# Patient Record
Sex: Female | Born: 2000 | Race: Black or African American | Hispanic: No | Marital: Single | Smoking: Never smoker
Health system: Southern US, Community
[De-identification: ages and names within clinical notes are randomized; demographics above are authoritative.]

## PROBLEM LIST (undated history)

## (undated) DIAGNOSIS — A64 Unspecified sexually transmitted disease: Secondary | ICD-10-CM

## (undated) DIAGNOSIS — Z8616 Personal history of COVID-19: Secondary | ICD-10-CM

## (undated) DIAGNOSIS — Z62810 Personal history of physical and sexual abuse in childhood: Secondary | ICD-10-CM

## (undated) DIAGNOSIS — F32A Depression, unspecified: Secondary | ICD-10-CM

## (undated) DIAGNOSIS — J45909 Unspecified asthma, uncomplicated: Secondary | ICD-10-CM

## (undated) DIAGNOSIS — Z8679 Personal history of other diseases of the circulatory system: Secondary | ICD-10-CM

## (undated) DIAGNOSIS — S79919A Unspecified injury of unspecified hip, initial encounter: Secondary | ICD-10-CM

## (undated) DIAGNOSIS — F329 Major depressive disorder, single episode, unspecified: Secondary | ICD-10-CM

## (undated) DIAGNOSIS — Z87898 Personal history of other specified conditions: Secondary | ICD-10-CM

## (undated) DIAGNOSIS — G43909 Migraine, unspecified, not intractable, without status migrainosus: Secondary | ICD-10-CM

## (undated) HISTORY — DX: Depression, unspecified: F32.A

## (undated) HISTORY — DX: Personal history of COVID-19: Z86.16

## (undated) HISTORY — DX: Unspecified injury of unspecified hip, initial encounter: S79.919A

## (undated) HISTORY — DX: Personal history of physical and sexual abuse in childhood: Z62.810

## (undated) HISTORY — DX: Migraine, unspecified, not intractable, without status migrainosus: G43.909

## (undated) HISTORY — DX: Personal history of other diseases of the circulatory system: Z86.79

## (undated) HISTORY — DX: Unspecified asthma, uncomplicated: J45.909

## (undated) HISTORY — DX: Unspecified sexually transmitted disease: A64

## (undated) HISTORY — DX: Personal history of other specified conditions: Z87.898

---
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
## 2019-01-16 ENCOUNTER — Encounter: Payer: Self-pay | Admitting: Family Medicine

## 2019-01-16 ENCOUNTER — Ambulatory Visit (INDEPENDENT_AMBULATORY_CARE_PROVIDER_SITE_OTHER): Payer: Managed Care, Other (non HMO) | Admitting: Family Medicine

## 2019-01-16 ENCOUNTER — Other Ambulatory Visit: Payer: Self-pay

## 2019-01-16 VITALS — BP 118/72 | HR 73 | Temp 98.0°F | Ht 62.25 in | Wt 173.8 lb

## 2019-01-16 DIAGNOSIS — G43909 Migraine, unspecified, not intractable, without status migrainosus: Secondary | ICD-10-CM

## 2019-01-16 DIAGNOSIS — J4520 Mild intermittent asthma, uncomplicated: Secondary | ICD-10-CM | POA: Diagnosis not present

## 2019-01-16 MED ORDER — ALBUTEROL SULFATE HFA 108 (90 BASE) MCG/ACT IN AERS: 2.0000 | INHALATION_SPRAY | Freq: Four times a day (QID) | RESPIRATORY_TRACT | 5 refills | Status: AC | PRN
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
## 2019-02-27 ENCOUNTER — Other Ambulatory Visit: Payer: Self-pay

## 2019-02-27 ENCOUNTER — Other Ambulatory Visit (HOSPITAL_COMMUNITY)
Admission: RE | Admit: 2019-02-27 | Discharge: 2019-02-27 | Disposition: A | Discharge status: Home / Self Care | Payer: Managed Care, Other (non HMO) | Source: Ambulatory Visit | Attending: Obstetrics and Gynecology | Admitting: Obstetrics and Gynecology

## 2019-02-27 ENCOUNTER — Ambulatory Visit: Payer: Managed Care, Other (non HMO) | Admitting: Obstetrics and Gynecology

## 2019-02-27 ENCOUNTER — Encounter: Payer: Self-pay | Admitting: Obstetrics and Gynecology

## 2019-02-27 VITALS — BP 110/66 | HR 84 | Temp 97.3°F | Resp 16 | Ht 61.5 in | Wt 177.0 lb

## 2019-02-27 DIAGNOSIS — Z01419 Encounter for gynecological examination (general) (routine) without abnormal findings: Secondary | ICD-10-CM | POA: Diagnosis not present

## 2019-02-27 DIAGNOSIS — Z113 Encounter for screening for infections with a predominantly sexual mode of transmission: Secondary | ICD-10-CM | POA: Insufficient documentation

## 2019-02-27 NOTE — Patient Instructions (Addendum)
What to Expect Before Expecting is a good book to read in preparation for pregnancy.   Testing for fertility will include ovulation testing for you, semen analysis for your partner, and a hysterosalpingogram to check your fallopian tubes for blockage.  EXERCISE AND DIET:  We recommended that you start or continue a regular exercise program for good health. Regular exercise means any activity that makes your heart beat faster and makes you sweat.  We recommend exercising at least 30 minutes per day at least 3 days a week, preferably 4 or 5.  We also recommend a diet low in fat and sugar.  Inactivity, poor dietary choices and obesity can cause diabetes, heart attack, stroke, and kidney damage, among others.    ALCOHOL AND SMOKING:  Women should limit their alcohol intake to no more than 7 drinks/beers/glasses of wine (combined, not each!) per week. Moderation of alcohol intake to this level decreases your risk of breast cancer and liver damage. And of course, no recreational drugs are part of a healthy lifestyle.  And absolutely no smoking or even second hand smoke. Most people know smoking can cause heart and lung diseases, but did you know it also contributes to weakening of your bones? Aging of your skin?  Yellowing of your teeth and nails?  CALCIUM AND VITAMIN D:  Adequate intake of calcium and Vitamin D are recommended.  The recommendations for exact amounts of these supplements seem to change often, but generally speaking 600 mg of calcium (either carbonate or citrate) and 800 units of Vitamin D per day seems prudent. Certain women may benefit from higher intake of Vitamin D.  If you are among these women, your doctor will have told you during your visit.    PAP SMEARS:  Pap smears, to check for cervical cancer or precancers,  have traditionally been done yearly, although recent scientific advances have shown that most women can have pap smears less often.  However, every woman still should have a  physical exam from her gynecologist every year. It will include a breast check, inspection of the vulva and vagina to check for abnormal growths or skin changes, a visual exam of the cervix, and then an exam to evaluate the size and shape of the uterus and ovaries.  And after 19 years of age, a rectal exam is indicated to check for rectal cancers. We will also provide age appropriate advice regarding health maintenance, like when you should have certain vaccines, screening for sexually transmitted diseases, bone density testing, colonoscopy, mammograms, etc.   MAMMOGRAMS:  All women over 19 years old should have a yearly mammogram. Many facilities now offer a "3D" mammogram, which may cost around $50 extra out of pocket. If possible,  we recommend you accept the option to have the 3D mammogram performed.  It both reduces the number of women who will be called back for extra views which then turn out to be normal, and it is better than the routine mammogram at detecting truly abnormal areas.    COLONOSCOPY:  Colonoscopy to screen for colon cancer is recommended for all women at age 19.  We know, you hate the idea of the prep.  We agree, BUT, having colon cancer and not knowing it is worse!!  Colon cancer so often starts as a polyp that can be seen and removed at colonscopy, which can quite literally save your life!  And if your first colonoscopy is normal and you have no family history of colon cancer, most  women don't have to have it again for 10 years.  Once every ten years, you can do something that may end up saving your life, right?  We will be happy to help you get it scheduled when you are ready.  Be sure to check your insurance coverage so you understand how much it will cost.  It may be covered as a preventative service at no cost, but you should check your particular policy.    HPV (Human Papillomavirus) Vaccine: What You Need to Know 1. Why get vaccinated? HPV (Human papillomavirus) vaccine can  prevent infection with some types of human papillomavirus. HPV infections can cause certain types of cancers including:  cervical, vaginal and vulvar cancers in women,  penile cancer in men, and  anal cancers in both men and women. HPV vaccine prevents infection from the HPV types that cause over 90% of these cancers. HPV is spread through intimate skin-to-skin or sexual contact. HPV infections are so common that nearly all men and women will get at least one type of HPV at some time in their lives. Most HPV infections go away by themselves within 2 years. But sometimes HPV infections will last longer and can cause cancers later in life. 2. HPV vaccine HPV vaccine is routinely recommended for adolescents at 65 or 19 years of age to ensure they are protected before they are exposed to the virus. HPV vaccine may be given beginning at age 64 years, and as late as age 48 years. Most people older than 26 years will not benefit from HPV vaccination. Talk with your health care provider if you want more information. Most children who get the first dose before 24 years of age need 2 doses of HPV vaccine. Anyone who gets the first dose on or after 19 years of age, and younger people with certain immunocompromising conditions, need 3 doses. Your health care provider can give you more information. HPV vaccine may be given at the same time as other vaccines. 3. Talk with your health care provider Tell your vaccine provider if the person getting the vaccine:  Has had an allergic reaction after a previous dose of HPV vaccine, or has any severe, life-threatening allergies.  Is pregnant. In some cases, your health care provider may decide to postpone HPV vaccination to a future visit. People with minor illnesses, such as a cold, may be vaccinated. People who are moderately or severely ill should usually wait until they recover before getting HPV vaccine. Your health care provider can give you more  information. 4. Risks of a vaccine reaction  Soreness, redness, or swelling where the shot is given can happen after HPV vaccine.  Fever or headache can happen after HPV vaccine. People sometimes faint after medical procedures, including vaccination. Tell your provider if you feel dizzy or have vision changes or ringing in the ears. As with any medicine, there is a very remote chance of a vaccine causing a severe allergic reaction, other serious injury, or death. 5. What if there is a serious problem? An allergic reaction could occur after the vaccinated person leaves the clinic. If you see signs of a severe allergic reaction (hives, swelling of the face and throat, difficulty breathing, a fast heartbeat, dizziness, or weakness), call 9-1-1 and get the person to the nearest hospital. For other signs that concern you, call your health care provider. Adverse reactions should be reported to the Vaccine Adverse Event Reporting System (VAERS). Your health care provider will usually file this report,  or you can do it yourself. Visit the VAERS website at www.vaers.LAgents.no or call (979)308-0291. VAERS is only for reporting reactions, and VAERS staff do not give medical advice. 6. The National Vaccine Injury Compensation Program The [employer] Vaccine Injury Compensation Program (VICP) is a federal program that was created to compensate people who may have been injured by certain vaccines. Visit the VICP website at SpiritualWord.at or call 908-007-3931 to learn about the program and about filing a claim. There is a time limit to file a claim for compensation. 7. How can I learn more?  Ask your health care provider.  Call your local or state health department.  Contact the Centers for Disease Control and Prevention (CDC): ? Call 9306716964 (1-800-CDC-INFO) or ? Visit CDC's website at PicCapture.uy Vaccine Information Statement HPV Vaccine (01/19/2018) This information is not  intended to replace advice given to you by your health care provider. Make sure you discuss any questions you have with your health care provider. Document Released: 10/04/2013 Document Revised: 06/28/2018 Document Reviewed: 10/19/2017 Elsevier Patient Education  2020 [employer].
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
## 2019-02-28 LAB — HEP, RPR, HIV PANEL
HIV Screen 4th Generation wRfx: NONREACTIVE
Hepatitis B Surface Ag: NEGATIVE
RPR Ser Ql: NONREACTIVE

## 2019-02-28 LAB — HEPATITIS C ANTIBODY: Hep C Virus Ab: <0.1 s/co ratio (ref 0.0–0.9)

## 2019-03-01 LAB — CERVICOVAGINAL ANCILLARY ONLY
Chlamydia: NEGATIVE
Comment: NEGATIVE
Comment: NEGATIVE
Comment: NORMAL
Neisseria Gonorrhea: NEGATIVE
Trichomonas: NEGATIVE

## 2019-05-27 IMAGING — CR DG ABDOMEN 1V
1 series · 2 of 2 positions shown · non-contrast
Comparison: None.

CLINICAL DATA: Umbilical pain.  Nausea.

EXAM:
ABDOMEN - 1 VIEW

[Series 1: dg abd 1 view · 0.14mm/px · 2 of 2 slices shown]
[im 1/2]
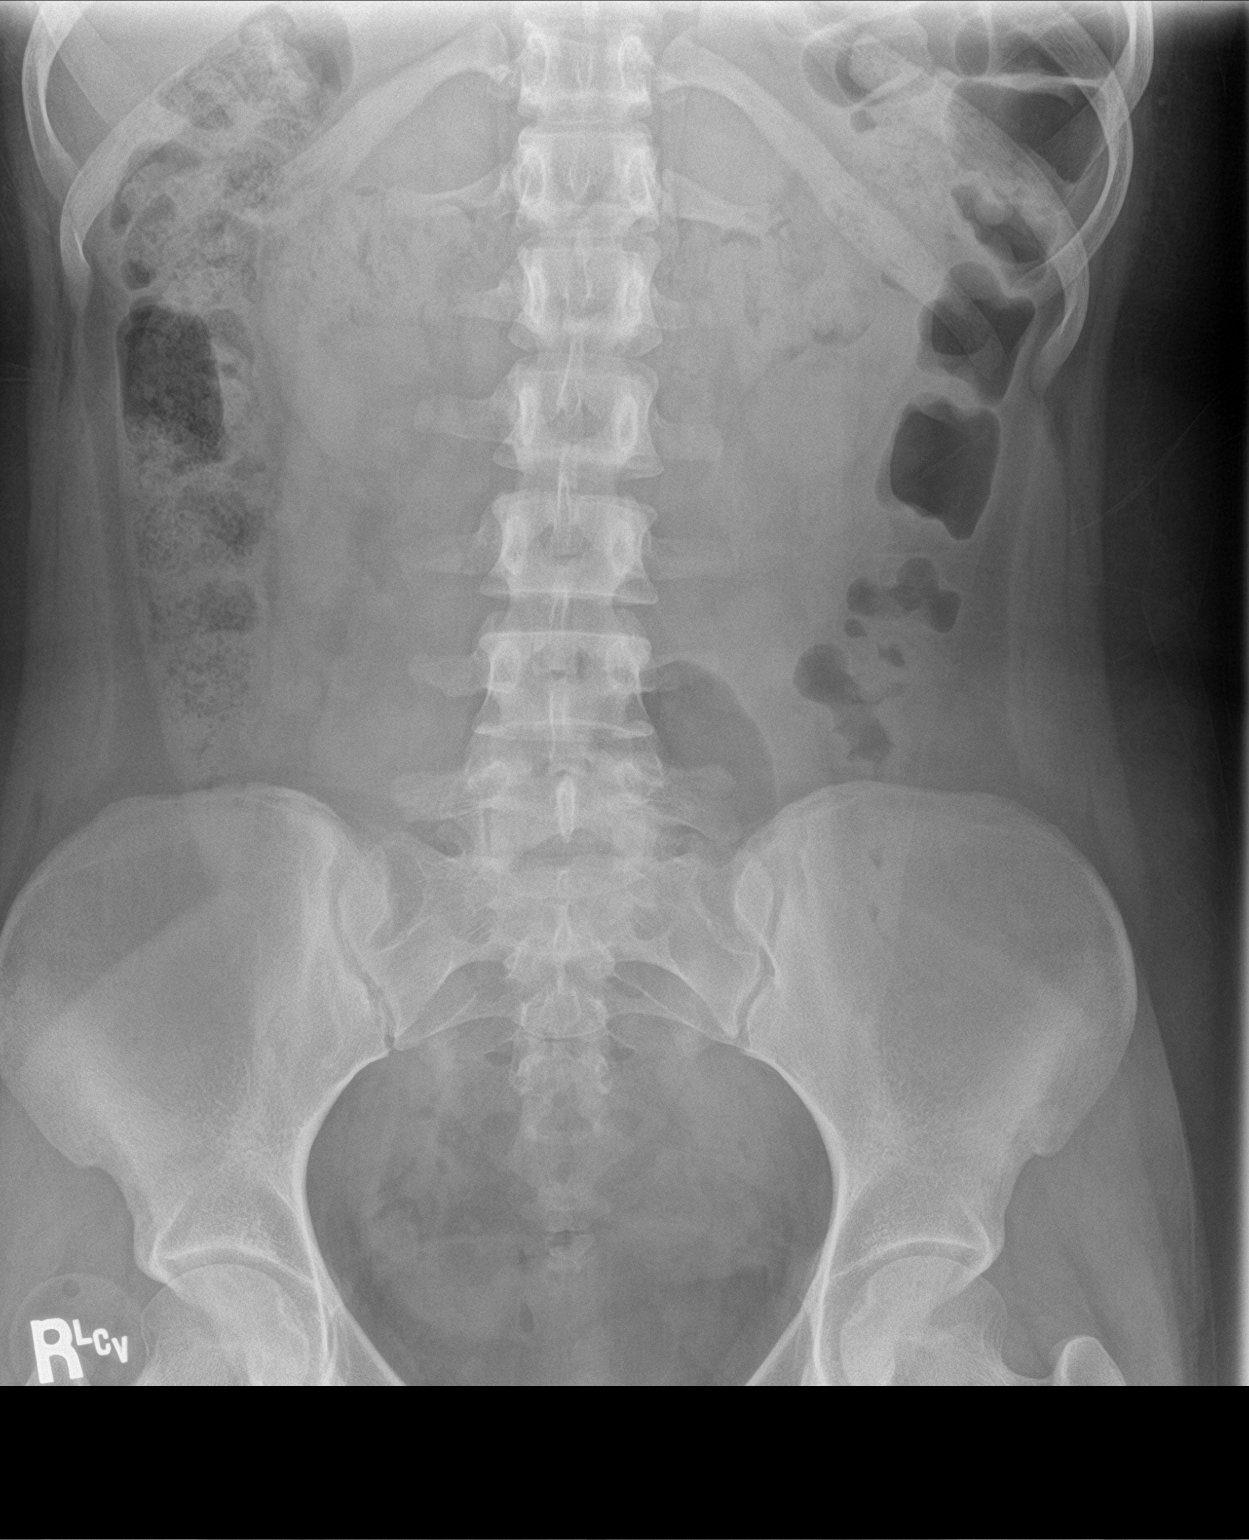
[im 2/2]
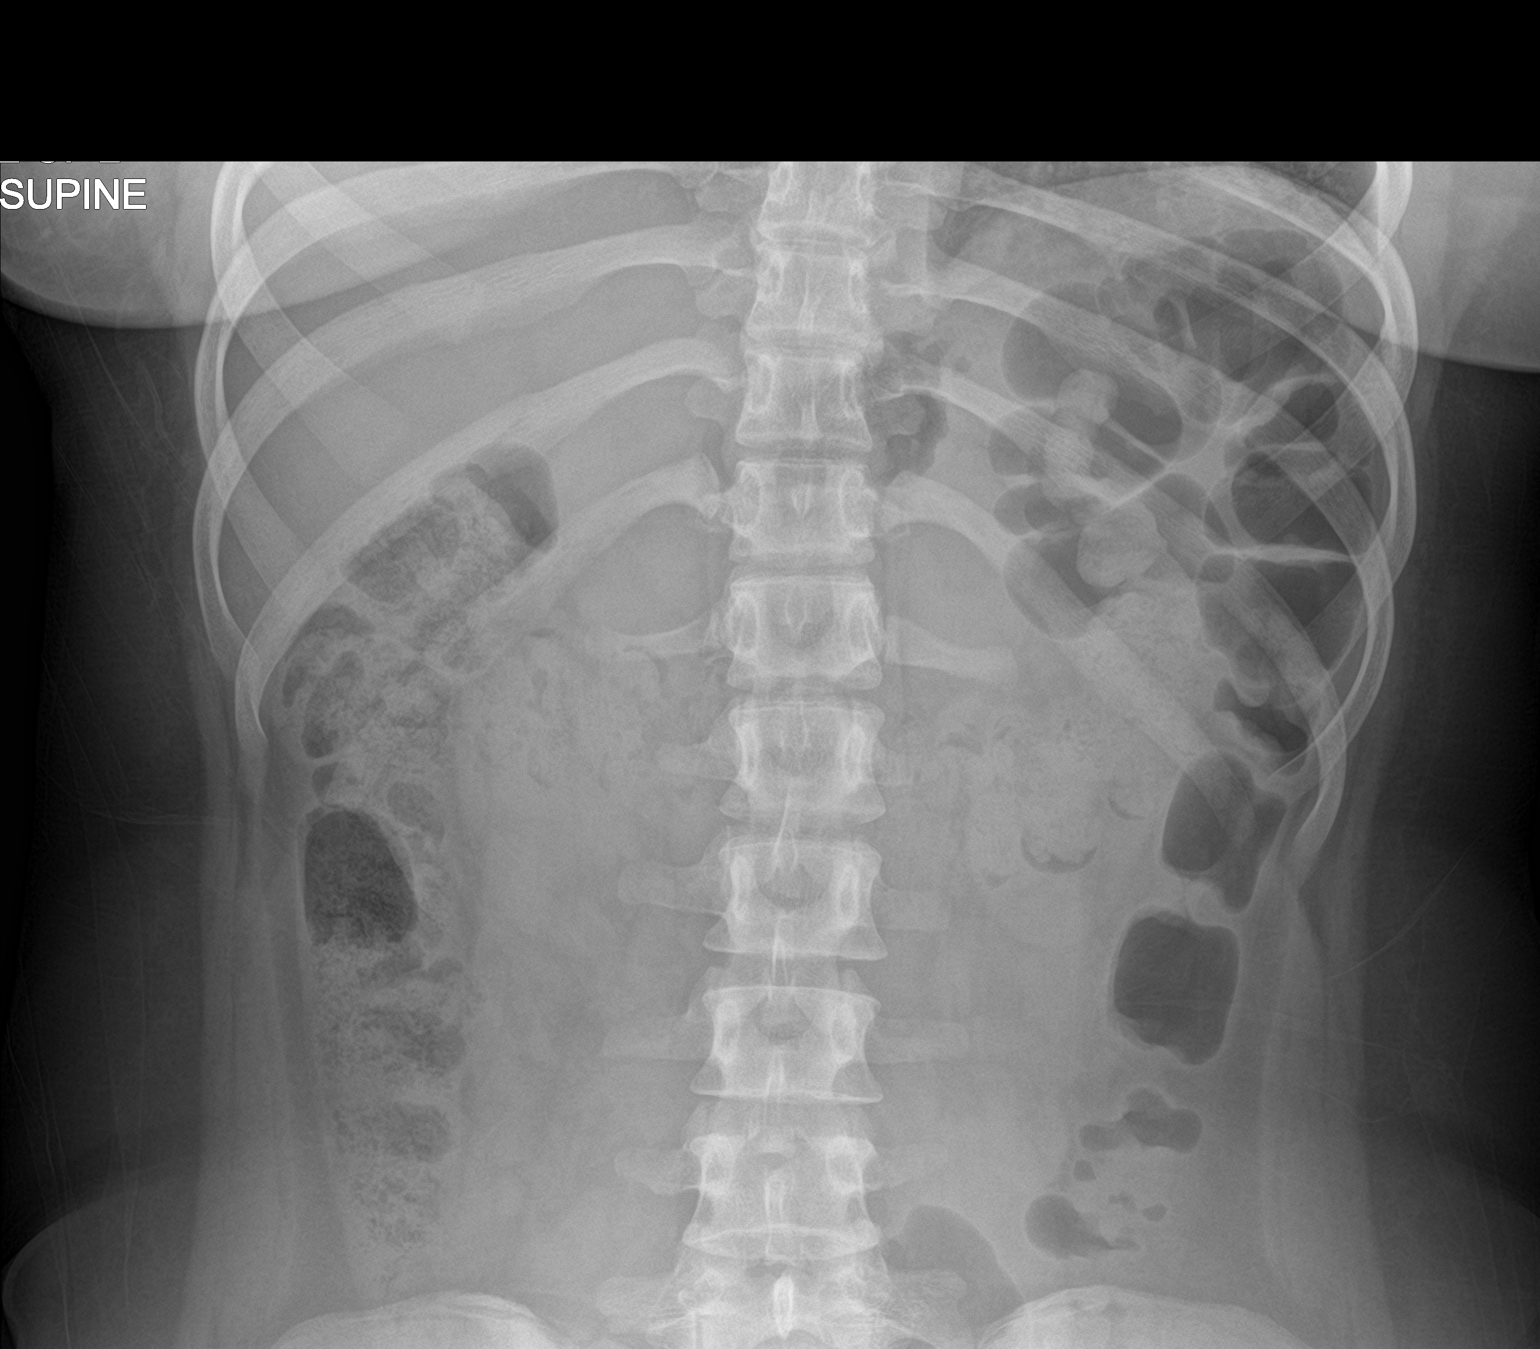

[2 of 2 positions shown; findings below may reference images not displayed]

FINDINGS: The bowel gas pattern is normal. No radio-opaque calculi or other
significant radiographic abnormality are seen.
IMPRESSION: Negative.

## 2019-06-27 ENCOUNTER — Ambulatory Visit: Payer: Managed Care, Other (non HMO) | Admitting: Obstetrics and Gynecology

## 2019-06-27 ENCOUNTER — Other Ambulatory Visit: Payer: Self-pay

## 2019-06-27 ENCOUNTER — Encounter: Payer: Self-pay | Admitting: Obstetrics and Gynecology

## 2019-06-27 ENCOUNTER — Telehealth: Payer: Self-pay | Admitting: Obstetrics and Gynecology

## 2019-06-27 VITALS — BP 100/68 | HR 76 | Temp 98.4°F | Ht 61.5 in | Wt 171.4 lb

## 2019-06-27 DIAGNOSIS — N926 Irregular menstruation, unspecified: Secondary | ICD-10-CM

## 2019-06-27 LAB — POCT URINE PREGNANCY: Preg Test, Ur: NEGATIVE
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
## 2019-06-27 NOTE — Telephone Encounter (Signed)
Encounter closed

## 2019-06-27 NOTE — Telephone Encounter (Signed)
Return call to patient. Was seen at urgent care yesterday ( has records) for abnormal menses and STD testing.  Condoms for contraception. LMP 06-03-19 and lighter than usual menses now with cramps and nausea. UPT negative at urgent care yesterday.  Has also had persistent external vaginal irritation similar to previous yeast infection.  Urgent care recommended pelvic ultrasound. Last seen in office 02-2019 for annual. ( Hx chlamydia and infertility)  Appointment scheduled for today at 215. Patient aware this is work-in.   Current ultrasound options at 1230 and 3pm.   Routing to Dr Edward Jolly for review.

## 2019-06-27 NOTE — Telephone Encounter (Signed)
Patient is having pain and discomfort in vaginal area.

## 2019-06-27 NOTE — Telephone Encounter (Signed)
I will see patient for her office visit and then determine her additional needs.

## 2019-06-28 LAB — BETA HCG QUANT (REF LAB): hCG Quant: <1 m[IU]/mL

## 2019-07-01 MED ORDER — MEDROXYPROGESTERONE ACETATE 10 MG PO TABS: 10.0000 mg | ORAL_TABLET | Freq: Every day | ORAL | 0 refills | Status: DC

## 2019-07-01 NOTE — Addendum Note (Signed)
Addended by: Ardell Isaacs, Debbe Bales E on: 07/01/2019 07:31 AM   Modules accepted: Orders

## 2019-11-13 ENCOUNTER — Emergency Department
Admission: EM | Admit: 2019-11-13 | Discharge: 2019-11-13 | Disposition: A | Discharge status: Home / Self Care | Payer: Managed Care, Other (non HMO) | Attending: Emergency Medicine | Admitting: Emergency Medicine

## 2019-11-13 ENCOUNTER — Other Ambulatory Visit: Payer: Self-pay

## 2019-11-13 ENCOUNTER — Encounter: Payer: Self-pay | Admitting: Emergency Medicine

## 2019-11-13 DIAGNOSIS — N611 Abscess of the breast and nipple: Secondary | ICD-10-CM | POA: Insufficient documentation

## 2019-11-13 DIAGNOSIS — J45909 Unspecified asthma, uncomplicated: Secondary | ICD-10-CM | POA: Diagnosis not present

## 2019-11-13 DIAGNOSIS — Z79899 Other long term (current) drug therapy: Secondary | ICD-10-CM | POA: Diagnosis not present

## 2019-11-13 DIAGNOSIS — L0291 Cutaneous abscess, unspecified: Secondary | ICD-10-CM

## 2019-11-13 MED ORDER — LIDOCAINE HCL 1 % IJ SOLN
5.0000 mL | Freq: Once | INTRAMUSCULAR | Status: DC
  Filled 2019-11-13: qty 10

## 2019-11-13 MED ORDER — FENTANYL CITRATE (PF) 100 MCG/2ML IJ SOLN
50.0000 ug | Freq: Once | INTRAMUSCULAR | Status: AC
  Administered 2019-11-13: 50 ug via INTRAMUSCULAR
  Filled 2019-11-13: qty 2

## 2019-11-13 MED ORDER — SULFAMETHOXAZOLE-TRIMETHOPRIM 800-160 MG PO TABS: 1.0000 | ORAL_TABLET | Freq: Two times a day (BID) | ORAL | 0 refills | Status: AC

## 2019-11-13 NOTE — ED Triage Notes (Signed)
Pt reports has an abscess under her right breast. Pt states this has been coming and going since November. Pt states the draining is thick and white but this time the abscess is more painful.
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
## 2019-11-13 NOTE — ED Notes (Signed)
Pt ambulatory from lobby in NAD. Pt reports abscess on right breast on and off since November. Pt reports she hit her breast while doing laundry today and it was painful.

## 2019-11-13 NOTE — Discharge Instructions (Signed)
Take Bactrim twice daily for the next 7 days. Do not submerge breast in fresh water for the next 48 hours. You can shower as normal in 24 hours.

## 2020-02-05 ENCOUNTER — Encounter: Payer: Self-pay | Admitting: Obstetrics & Gynecology

## 2020-02-05 ENCOUNTER — Ambulatory Visit (INDEPENDENT_AMBULATORY_CARE_PROVIDER_SITE_OTHER): Payer: Managed Care, Other (non HMO) | Admitting: Obstetrics & Gynecology

## 2020-02-05 ENCOUNTER — Encounter: Payer: Self-pay | Admitting: Radiology

## 2020-02-05 ENCOUNTER — Other Ambulatory Visit: Payer: Self-pay

## 2020-02-05 VITALS — BP 114/73 | HR 86 | Wt 162.0 lb

## 2020-02-05 DIAGNOSIS — O99211 Obesity complicating pregnancy, first trimester: Secondary | ICD-10-CM | POA: Diagnosis not present

## 2020-02-05 DIAGNOSIS — Z3401 Encounter for supervision of normal first pregnancy, first trimester: Secondary | ICD-10-CM | POA: Diagnosis not present

## 2020-02-05 DIAGNOSIS — Z3400 Encounter for supervision of normal first pregnancy, unspecified trimester: Secondary | ICD-10-CM | POA: Insufficient documentation

## 2020-02-05 DIAGNOSIS — E669 Obesity, unspecified: Secondary | ICD-10-CM

## 2020-02-05 DIAGNOSIS — Z3A11 11 weeks gestation of pregnancy: Secondary | ICD-10-CM | POA: Diagnosis not present

## 2020-02-05 DIAGNOSIS — Z6830 Body mass index (BMI) 30.0-30.9, adult: Secondary | ICD-10-CM

## 2020-02-05 MED ORDER — SCOPOLAMINE 1 MG/3DAYS TD PT72: 1.0000 | MEDICATED_PATCH | TRANSDERMAL | 0 refills | Status: DC

## 2020-02-05 MED ORDER — ONDANSETRON 4 MG PO TBDP: 4.0000 mg | ORAL_TABLET | Freq: Four times a day (QID) | ORAL | 0 refills | Status: DC | PRN

## 2020-02-05 NOTE — Progress Notes (Signed)
Decline flu vaccine. 

## 2020-02-05 NOTE — Progress Notes (Signed)
DATING AND VIABILITY SONOGRAM   Carly Williams is a 19 y.o. year old G1P0000 with LMP Patient's last menstrual period was 11/16/2019 (exact date). which would correlate to  [redacted]w[redacted]d weeks gestation.  She has regular menstrual cycles.   She is here today for a confirmatory initial sonogram.    GESTATION: SINGLETON yes     FETAL ACTIVITY:          Heart rate         173          The fetus is active.     GESTATIONAL AGE AND  BIOMETRICS:  Gestational criteria: Estimated Date of Delivery: 08/22/20 by LMP now at [redacted]w[redacted]d  Previous Scans:0  GESTATIONAL SAC            mm          weeks  CROWN RUMP LENGTH           4.77 cm         11.3 weeks                                                   AVERAGE EGA(BY THIS SCAN):  11.3 weeks  WORKING EDD( LMP ):  08/22/2020     TECHNICIAN COMMENTS: Patient informed that the ultrasound is considered a limited obstetric ultrasound and is not intended to be a complete ultrasound exam. Patient also informed that the ultrasound is not being completed with the intent of assessing for fetal or placental anomalies or any pelvic abnormalities. Explained that the purpose of today's ultrasound is to assess for fetal heart rate. Patient acknowledges the purpose of the exam and the limitations of the study.         A copy of this report including all images has been saved and backed up to a second source for retrieval if needed. All measures and details of the anatomical scan, placentation, fluid volume and pelvic anatomy are contained in that report.  Scheryl Marten 02/05/2020 2:02 PM

## 2020-02-05 NOTE — Patient Instructions (Signed)
First Trimester of Pregnancy The first trimester of pregnancy is from week 1 until the end of week 13 (months 1 through 3). A week after a sperm fertilizes an egg, the egg will implant on the wall of the uterus. This embryo will begin to develop into a baby. Genes from you and your partner will form the baby. The female genes will determine whether the baby will be a boy or a girl. At 6-8 weeks, the eyes and face will be formed, and the heartbeat can be seen on ultrasound. At the end of 12 weeks, all the baby's organs will be formed. Now that you are pregnant, you will want to do everything you can to have a healthy baby. Two of the most important things are to get good prenatal care and to follow your health care provider's instructions. Prenatal care is all the medical care you receive before the baby's birth. This care will help prevent, find, and treat any problems during the pregnancy and childbirth. Body changes during your first trimester Your body goes through many changes during pregnancy. The changes vary from woman to woman.  You may gain or lose a couple of pounds at first.  You may feel sick to your stomach (nauseous) and you may throw up (vomit). If the vomiting is uncontrollable, call your health care provider.  You may tire easily.  You may develop headaches that can be relieved by medicines. All medicines should be approved by your health care provider.  You may urinate more often. Painful urination may mean you have a bladder infection.  You may develop heartburn as a result of your pregnancy.  You may develop constipation because certain hormones are causing the muscles that push stool through your intestines to slow down.  You may develop hemorrhoids or swollen veins (varicose veins).  Your breasts may begin to grow larger and become tender. Your nipples may stick out more, and the tissue that surrounds them (areola) may become darker.  Your gums may bleed and may be  sensitive to brushing and flossing.  Dark spots or blotches (chloasma, mask of pregnancy) may develop on your face. This will likely fade after the baby is born.  Your menstrual periods will stop.  You may have a loss of appetite.  You may develop cravings for certain kinds of food.  You may have changes in your emotions from day to day, such as being excited to be pregnant or being concerned that something may go wrong with the pregnancy and baby.  You may have more vivid and strange dreams.  You may have changes in your hair. These can include thickening of your hair, rapid growth, and changes in texture. Some women also have hair loss during or after pregnancy, or hair that feels dry or thin. Your hair will most likely return to normal after your baby is born. What to expect at prenatal visits During a routine prenatal visit:  You will be weighed to make sure you and the baby are growing normally.  Your blood pressure will be taken.  Your abdomen will be measured to track your baby's growth.  The fetal heartbeat will be listened to between weeks 10 and 14 of your pregnancy.  Test results from any previous visits will be discussed. Your health care provider may ask you:  How you are feeling.  If you are feeling the baby move.  If you have had any abnormal symptoms, such as leaking fluid, bleeding, severe headaches, or abdominal   cramping.  If you are using any tobacco products, including cigarettes, chewing tobacco, and electronic cigarettes.  If you have any questions. Other tests that may be performed during your first trimester include:  Blood tests to find your blood type and to check for the presence of any previous infections. The tests will also be used to check for low iron levels (anemia) and protein on red blood cells (Rh antibodies). Depending on your risk factors, or if you previously had diabetes during pregnancy, you may have tests to check for high blood sugar  that affects pregnant women (gestational diabetes).  Urine tests to check for infections, diabetes, or protein in the urine.  An ultrasound to confirm the proper growth and development of the baby.  Fetal screens for spinal cord problems (spina bifida) and Down syndrome.  HIV (human immunodeficiency virus) testing. Routine prenatal testing includes screening for HIV, unless you choose not to have this test.  You may need other tests to make sure you and the baby are doing well. Follow these instructions at home: Medicines  Follow your health care provider's instructions regarding medicine use. Specific medicines may be either safe or unsafe to take during pregnancy.  Take a prenatal vitamin that contains at least 600 micrograms (mcg) of folic acid.  If you develop constipation, try taking a stool softener if your health care provider approves. Eating and drinking   Eat a balanced diet that includes fresh fruits and vegetables, whole grains, good sources of protein such as meat, eggs, or tofu, and low-fat dairy. Your health care provider will help you determine the amount of weight gain that is right for you.  Avoid raw meat and uncooked cheese. These carry germs that can cause birth defects in the baby.  Eating four or five small meals rather than three large meals a day may help relieve nausea and vomiting. If you start to feel nauseous, eating a few soda crackers can be helpful. Drinking liquids between meals, instead of during meals, also seems to help ease nausea and vomiting.  Limit foods that are high in fat and processed sugars, such as fried and sweet foods.  To prevent constipation: ? Eat foods that are high in fiber, such as fresh fruits and vegetables, whole grains, and beans. ? Drink enough fluid to keep your urine clear or pale yellow. Activity  Exercise only as directed by your health care provider. Most women can continue their usual exercise routine during  pregnancy. Try to exercise for 30 minutes at least 5 days a week. Exercising will help you: ? Control your weight. ? Stay in shape. ? Be prepared for labor and delivery.  Experiencing pain or cramping in the lower abdomen or lower back is a good sign that you should stop exercising. Check with your health care provider before continuing with normal exercises.  Try to avoid standing for long periods of time. Move your legs often if you must stand in one place for a long time.  Avoid heavy lifting.  Wear low-heeled shoes and practice good posture.  You may continue to have sex unless your health care provider tells you not to. Relieving pain and discomfort  Wear a good support bra to relieve breast tenderness.  Take warm sitz baths to soothe any pain or discomfort caused by hemorrhoids. Use hemorrhoid cream if your health care provider approves.  Rest with your legs elevated if you have leg cramps or low back pain.  If you develop varicose veins in   your legs, wear support hose. Elevate your feet for 15 minutes, 3-4 times a day. Limit salt in your diet. Prenatal care  Schedule your prenatal visits by the twelfth week of pregnancy. They are usually scheduled monthly at first, then more often in the last 2 months before delivery.  Write down your questions. Take them to your prenatal visits.  Keep all your prenatal visits as told by your health care provider. This is important. Safety  Wear your seat belt at all times when driving.  Make a list of emergency phone numbers, including numbers for family, friends, the hospital, and police and fire departments. General instructions  Ask your health care provider for a referral to a local prenatal education class. Begin classes no later than the beginning of month 6 of your pregnancy.  Ask for help if you have counseling or nutritional needs during pregnancy. Your health care provider can offer advice or refer you to specialists for help  with various needs.  Do not use hot tubs, steam rooms, or saunas.  Do not douche or use tampons or scented sanitary pads.  Do not cross your legs for long periods of time.  Avoid cat litter boxes and soil used by cats. These carry germs that can cause birth defects in the baby and possibly loss of the fetus by miscarriage or stillbirth.  Avoid all smoking, herbs, alcohol, and medicines not prescribed by your health care provider. Chemicals in these products affect the formation and growth of the baby.  Do not use any products that contain nicotine or tobacco, such as cigarettes and e-cigarettes. If you need help quitting, ask your health care provider. You may receive counseling support and other resources to help you quit.  Schedule a dentist appointment. At home, brush your teeth with a soft toothbrush and be gentle when you floss. Contact a health care provider if:  You have dizziness.  You have mild pelvic cramps, pelvic pressure, or nagging pain in the abdominal area.  You have persistent nausea, vomiting, or diarrhea.  You have a bad smelling vaginal discharge.  You have pain when you urinate.  You notice increased swelling in your face, hands, legs, or ankles.  You are exposed to fifth disease or chickenpox.  You are exposed to German measles (rubella) and have never had it. Get help right away if:  You have a fever.  You are leaking fluid from your vagina.  You have spotting or bleeding from your vagina.  You have severe abdominal cramping or pain.  You have rapid weight gain or loss.  You vomit blood or material that looks like coffee grounds.  You develop a severe headache.  You have shortness of breath.  You have any kind of trauma, such as from a fall or a car accident. Summary  The first trimester of pregnancy is from week 1 until the end of week 13 (months 1 through 3).  Your body goes through many changes during pregnancy. The changes vary from  woman to woman.  You will have routine prenatal visits. During those visits, your health care provider will examine you, discuss any test results you may have, and talk with you about how you are feeling. This information is not intended to replace advice given to you by your health care provider. Make sure you discuss any questions you have with your health care provider. Document Revised: 02/19/2017 Document Reviewed: 02/19/2016 Elsevier Patient Education  2020 [employer]  
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
## 2020-02-06 LAB — CBC/D/PLT+RPR+RH+ABO+RUB AB...
Antibody Screen: NEGATIVE
Basophils Absolute: 0 10*3/uL (ref 0.0–0.2)
Basos: 0 %
EOS (ABSOLUTE): 0.1 10*3/uL (ref 0.0–0.4)
Eos: 0 %
HCV Ab: <0.1 s/co ratio (ref 0.0–0.9)
HIV Screen 4th Generation wRfx: NONREACTIVE
Hematocrit: 38.8 % (ref 34.0–46.6)
Hemoglobin: 13.2 g/dL (ref 11.1–15.9)
Hepatitis B Surface Ag: NEGATIVE
Immature Grans (Abs): 0.1 10*3/uL (ref 0.0–0.1)
Immature Granulocytes: 1 %
Lymphocytes Absolute: 2.4 10*3/uL (ref 0.7–3.1)
Lymphs: 21 %
MCH: 30.3 pg (ref 26.6–33.0)
MCHC: 34 g/dL (ref 31.5–35.7)
MCV: 89 fL (ref 79–97)
Monocytes Absolute: 0.9 10*3/uL (ref 0.1–0.9)
Monocytes: 8 %
Neutrophils Absolute: 8.1 10*3/uL — ABNORMAL HIGH (ref 1.4–7.0)
Neutrophils: 70 %
Platelets: 272 10*3/uL (ref 150–450)
RBC: 4.35 x10E6/uL (ref 3.77–5.28)
RDW: 12.2 % (ref 11.7–15.4)
RPR Ser Ql: NONREACTIVE
Rh Factor: POSITIVE
Rubella Antibodies, IGG: 10.3 index (ref >0.99)
WBC: 11.5 10*3/uL — ABNORMAL HIGH (ref 3.4–10.8)

## 2020-02-06 LAB — HCV INTERPRETATION

## 2020-02-07 LAB — CHLAMYDIA/GONOCOCCUS/TRICHOMONAS, NAA
Chlamydia by NAA: NEGATIVE
Gonococcus by NAA: NEGATIVE
Trich vag by NAA: NEGATIVE

## 2020-02-07 LAB — CULTURE, OB URINE

## 2020-02-07 LAB — URINE CULTURE, OB REFLEX

## 2020-02-09 LAB — MATERNIT21  PLUS CORE+ESS+SCA, BLOOD

## 2020-02-22 ENCOUNTER — Ambulatory Visit (INDEPENDENT_AMBULATORY_CARE_PROVIDER_SITE_OTHER): Payer: Managed Care, Other (non HMO) | Admitting: Obstetrics and Gynecology

## 2020-02-22 ENCOUNTER — Other Ambulatory Visit: Payer: Self-pay

## 2020-02-22 ENCOUNTER — Encounter: Payer: Self-pay | Admitting: Obstetrics and Gynecology

## 2020-02-22 VITALS — BP 110/73 | HR 93 | Wt 166.6 lb

## 2020-02-22 DIAGNOSIS — Z7689 Persons encountering health services in other specified circumstances: Secondary | ICD-10-CM | POA: Diagnosis not present

## 2020-02-22 DIAGNOSIS — Z3400 Encounter for supervision of normal first pregnancy, unspecified trimester: Secondary | ICD-10-CM

## 2020-02-22 DIAGNOSIS — Z3A14 14 weeks gestation of pregnancy: Secondary | ICD-10-CM
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
## 2020-03-04 ENCOUNTER — Encounter: Payer: Managed Care, Other (non HMO) | Admitting: Obstetrics & Gynecology

## 2020-03-08 ENCOUNTER — Telehealth: Payer: Self-pay

## 2020-03-08 NOTE — Telephone Encounter (Signed)
Please advise. Thanks Abhinav Mayorquin 

## 2020-03-08 NOTE — Telephone Encounter (Signed)
Lm for patient to return call.

## 2020-03-08 NOTE — Telephone Encounter (Signed)
Patient stated that her roommate has tested positive for Covid. Patient is on her way to get tested as we was on the phone at one of the drive up sites. Patient denies having any symptoms at this time. Patient ask if she starts having symptoms if we would be able to see her in the office I explained that we are not seeing Covid positive patients in the office. We are sending them to urgent care or the hospital. I apologized and told her that I know this is a scary time. I told her that if the test came back negative to stay quarantined from her friend, wear her mask and hand sanitize. If it comes back positive to make sure that she stays well hydrated.

## 2020-03-08 NOTE — Telephone Encounter (Signed)
Pt called in and stated that her roommate has been exposed to covid. The pt deny's having the vaccine . The pt was advise to contact her OB. The pt has no symptoms. Pt was advised to please call back if she started to show symptoms. Please advise

## 2020-03-09 ENCOUNTER — Emergency Department: Payer: Managed Care, Other (non HMO)

## 2020-03-09 ENCOUNTER — Emergency Department
Admission: EM | Admit: 2020-03-09 | Discharge: 2020-03-09 | Disposition: A | Discharge status: Home / Self Care | Payer: Managed Care, Other (non HMO) | Attending: Emergency Medicine | Admitting: Emergency Medicine

## 2020-03-09 ENCOUNTER — Other Ambulatory Visit: Payer: Self-pay

## 2020-03-09 DIAGNOSIS — O26892 Other specified pregnancy related conditions, second trimester: Secondary | ICD-10-CM | POA: Diagnosis not present

## 2020-03-09 DIAGNOSIS — O2342 Unspecified infection of urinary tract in pregnancy, second trimester: Secondary | ICD-10-CM | POA: Diagnosis not present

## 2020-03-09 DIAGNOSIS — O219 Vomiting of pregnancy, unspecified: Secondary | ICD-10-CM | POA: Diagnosis not present

## 2020-03-09 DIAGNOSIS — Z3A16 16 weeks gestation of pregnancy: Secondary | ICD-10-CM | POA: Diagnosis not present

## 2020-03-09 DIAGNOSIS — R1084 Generalized abdominal pain: Secondary | ICD-10-CM | POA: Insufficient documentation

## 2020-03-09 DIAGNOSIS — J45909 Unspecified asthma, uncomplicated: Secondary | ICD-10-CM | POA: Insufficient documentation

## 2020-03-09 LAB — URINALYSIS, COMPLETE (UACMP) WITH MICROSCOPIC
Bilirubin Urine: NEGATIVE
Glucose, UA: NEGATIVE mg/dL
Hgb urine dipstick: NEGATIVE
Ketones, ur: NEGATIVE mg/dL
Nitrite: NEGATIVE
Protein, ur: NEGATIVE mg/dL
Specific Gravity, Urine: 1.024 (ref 1.005–1.030)
pH: 5 (ref 5.0–8.0)

## 2020-03-09 LAB — COMPREHENSIVE METABOLIC PANEL
ALT: 39 U/L (ref 0–44)
AST: 31 U/L (ref 15–41)
Albumin: 3.4 g/dL — ABNORMAL LOW (ref 3.5–5.0)
Alkaline Phosphatase: 53 U/L (ref 38–126)
Anion gap: 11 (ref 5–15)
BUN: 6 mg/dL (ref 6–20)
CO2: 21 mmol/L — ABNORMAL LOW (ref 22–32)
Calcium: 8.9 mg/dL (ref 8.9–10.3)
Chloride: 106 mmol/L (ref 98–111)
Creatinine, Ser: 0.44 mg/dL (ref 0.44–1.00)
GFR, Estimated: >60 mL/min (ref >60)
Glucose, Bld: 86 mg/dL (ref 70–99)
Potassium: 3.7 mmol/L (ref 3.5–5.1)
Sodium: 138 mmol/L (ref 135–145)
Total Bilirubin: 0.7 mg/dL (ref 0.3–1.2)
Total Protein: 6.9 g/dL (ref 6.5–8.1)

## 2020-03-09 LAB — CBC WITH DIFFERENTIAL/PLATELET
Abs Immature Granulocytes: 0.09 10*3/uL — ABNORMAL HIGH (ref 0.00–0.07)
Basophils Absolute: 0 10*3/uL (ref 0.0–0.1)
Basophils Relative: 0 %
Eosinophils Absolute: 0 10*3/uL (ref 0.0–0.5)
Eosinophils Relative: 0 %
HCT: 36.4 % (ref 36.0–46.0)
Hemoglobin: 12.7 g/dL (ref 12.0–15.0)
Immature Granulocytes: 1 %
Lymphocytes Relative: 15 %
Lymphs Abs: 2.1 10*3/uL (ref 0.7–4.0)
MCH: 31.2 pg (ref 26.0–34.0)
MCHC: 34.9 g/dL (ref 30.0–36.0)
MCV: 89.4 fL (ref 80.0–100.0)
Monocytes Absolute: 0.8 10*3/uL (ref 0.1–1.0)
Monocytes Relative: 6 %
Neutro Abs: 10.8 10*3/uL — ABNORMAL HIGH (ref 1.7–7.7)
Neutrophils Relative %: 78 %
Platelets: 233 10*3/uL (ref 150–400)
RBC: 4.07 MIL/uL (ref 3.87–5.11)
RDW: 12.9 % (ref 11.5–15.5)
WBC: 13.9 10*3/uL — ABNORMAL HIGH (ref 4.0–10.5)
nRBC: 0 % (ref 0.0–0.2)

## 2020-03-09 LAB — HCG, QUANTITATIVE, PREGNANCY: hCG, Beta Chain, Quant, S: 40656 m[IU]/mL — ABNORMAL HIGH (ref <5)

## 2020-03-09 MED ORDER — ONDANSETRON 4 MG PO TBDP: 4.0000 mg | ORAL_TABLET | Freq: Three times a day (TID) | ORAL | 0 refills | Status: DC | PRN

## 2020-03-09 MED ORDER — LACTATED RINGERS IV BOLUS
1000.0000 mL | Freq: Once | INTRAVENOUS | Status: AC
  Administered 2020-03-09: 1000 mL via INTRAVENOUS

## 2020-03-09 MED ORDER — ONDANSETRON HCL 4 MG/2ML IJ SOLN
4.0000 mg | Freq: Once | INTRAMUSCULAR | Status: AC
  Administered 2020-03-09: 4 mg via INTRAVENOUS
  Filled 2020-03-09: qty 2

## 2020-03-09 MED ORDER — ALUM & MAG HYDROXIDE-SIMETH 200-200-20 MG/5ML PO SUSP
30.0000 mL | Freq: Once | ORAL | Status: AC
  Administered 2020-03-09: 30 mL via ORAL
  Filled 2020-03-09: qty 30

## 2020-03-09 MED ORDER — CEPHALEXIN 500 MG PO CAPS: 500.0000 mg | ORAL_CAPSULE | Freq: Three times a day (TID) | ORAL | 0 refills | Status: AC

## 2020-03-09 NOTE — ED Triage Notes (Signed)
Unable to reassess pt VS at this time due to pt being in [place].
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
## 2020-03-09 NOTE — ED Triage Notes (Signed)
Pt states is [redacted] weeks pregnant edd 09/05/20. Pt states is having mid abd pain, no vaginal bleeding or unusual discharge.

## 2020-03-11 ENCOUNTER — Other Ambulatory Visit: Payer: Self-pay | Admitting: Unknown Physician Specialty

## 2020-03-11 ENCOUNTER — Encounter: Payer: Self-pay | Admitting: Unknown Physician Specialty

## 2020-03-11 ENCOUNTER — Telehealth: Payer: Self-pay | Admitting: Unknown Physician Specialty

## 2020-03-11 ENCOUNTER — Encounter: Payer: Self-pay | Admitting: Surgical

## 2020-03-11 DIAGNOSIS — Z6830 Body mass index (BMI) 30.0-30.9, adult: Secondary | ICD-10-CM

## 2020-03-11 DIAGNOSIS — E669 Obesity, unspecified: Secondary | ICD-10-CM

## 2020-03-11 DIAGNOSIS — U071 COVID-19: Secondary | ICD-10-CM

## 2020-03-11 LAB — URINE CULTURE: Culture: 40000 — AB

## 2020-03-11 NOTE — Telephone Encounter (Signed)
I connected by phone with [name] on 03/11/2020 at 4:52 PM to discuss the potential use of a new treatment for mild to moderate COVID-19 viral infection in non-hospitalized patients.  This patient is a 19 y.o. female that meets the FDA criteria for Emergency Use Authorization of COVID monoclonal antibody casirivimab/imdevimab, bamlanivimab/etesevimab, or sotrovimab.  Has a (+) direct SARS-CoV-2 viral test result  Has mild or moderate COVID-19   Is NOT hospitalized due to COVID-19  Is within 10 days of symptom onset  Has at least one of the high risk factor(s) for progression to severe COVID-19 and/or hospitalization as defined in EUA.  Specific high risk criteria : BMI > 25   I have spoken and communicated the following to the patient or parent/caregiver regarding COVID monoclonal antibody treatment:  1. FDA has authorized the emergency use for the treatment of mild to moderate COVID-19 in adults and pediatric patients with positive results of direct SARS-CoV-2 viral testing who are 23 years of age and older weighing at least 40 kg, and who are at high risk for progressing to severe COVID-19 and/or hospitalization.  2. The significant known and potential risks and benefits of COVID monoclonal antibody, and the extent to which such potential risks and benefits are unknown.  3. Information on available alternative treatments and the risks and benefits of those alternatives, including clinical trials.  4. Patients treated with COVID monoclonal antibody should continue to self-isolate and use infection control measures (e.g., wear mask, isolate, social distance, avoid sharing personal items, clean and disinfect "high touch" surfaces, and frequent handwashing) according to CDC guidelines.   5. The patient or parent/caregiver has the option to accept or refuse COVID monoclonal antibody treatment.  After reviewing with the patient, she is agreeable and will contact OB if Ok to infuse.

## 2020-03-11 NOTE — Telephone Encounter (Signed)
Patient tested positive for Covid today. Patient also went to the Ed over the weekend for UTI symptoms and they put her on her abx. I am going to send her a list of safe medications to take through my chart. I have rescheduled patient to come in on 03/26/2020 to see Dr. Valentino Saxon.

## 2020-03-11 NOTE — Telephone Encounter (Signed)
Please advise 

## 2020-03-11 NOTE — Telephone Encounter (Signed)
Pt called in and stated that she has tested positive for covid today. The pt was requesting a call back from the nurse. I told the pt that I will send a message to the nurse and I told the pt I will cancel her appt for the 29th. Please advise

## 2020-03-11 NOTE — Telephone Encounter (Signed)
MAB infusion- Contacted Dr. Valentino Saxon and gave OK for monoclonal ab.  Directions given.

## 2020-03-12 ENCOUNTER — Ambulatory Visit (HOSPITAL_COMMUNITY)
Admission: RE | Admit: 2020-03-12 | Discharge: 2020-03-12 | Disposition: A | Discharge status: Home / Self Care | Payer: Managed Care, Other (non HMO) | Source: Ambulatory Visit | Attending: Pulmonary Disease | Admitting: Pulmonary Disease

## 2020-03-12 ENCOUNTER — Other Ambulatory Visit (HOSPITAL_COMMUNITY): Payer: Self-pay

## 2020-03-12 DIAGNOSIS — E669 Obesity, unspecified: Secondary | ICD-10-CM | POA: Diagnosis present

## 2020-03-12 DIAGNOSIS — U071 COVID-19: Secondary | ICD-10-CM

## 2020-03-12 DIAGNOSIS — Z6830 Body mass index (BMI) 30.0-30.9, adult: Secondary | ICD-10-CM | POA: Insufficient documentation

## 2020-03-12 MED ORDER — METHYLPREDNISOLONE SODIUM SUCC 125 MG IJ SOLR: 125.0000 mg | Freq: Once | INTRAMUSCULAR | Status: DC | PRN

## 2020-03-12 MED ORDER — DIPHENHYDRAMINE HCL 50 MG/ML IJ SOLN: 50.0000 mg | Freq: Once | INTRAMUSCULAR | Status: DC | PRN

## 2020-03-12 MED ORDER — SODIUM CHLORIDE 0.9 % IV SOLN: Freq: Once | INTRAVENOUS | Status: AC

## 2020-03-12 MED ORDER — SODIUM CHLORIDE 0.9 % IV SOLN: INTRAVENOUS | Status: DC | PRN

## 2020-03-12 MED ORDER — EPINEPHRINE 0.3 MG/0.3ML IJ SOAJ: 0.3000 mg | Freq: Once | INTRAMUSCULAR | Status: DC | PRN

## 2020-03-12 MED ORDER — FAMOTIDINE IN NACL 20-0.9 MG/50ML-% IV SOLN: 20.0000 mg | Freq: Once | INTRAVENOUS | Status: DC | PRN

## 2020-03-12 MED ORDER — ALBUTEROL SULFATE HFA 108 (90 BASE) MCG/ACT IN AERS: 2.0000 | INHALATION_SPRAY | Freq: Once | RESPIRATORY_TRACT | Status: DC | PRN

## 2020-03-12 NOTE — Discharge Instructions (Signed)
If you have any questions or concerns after the infusion please call the Advanced Practice Provider on call at [phone]. This number is ONLY intended for your use regarding questions or concerns about the infusion post-treatment side-effects.  Please do not provide this number to others for use. For return to work notes please contact your primary care provider.  ° °If someone you know is interested in receiving treatment please have them call the COVID hotline at [phone]. ° ° ° ° ° ° °What types of side effects do monoclonal antibody drugs cause?  °Common side effects ° °In general, the more common side effects caused by monoclonal antibody drugs include: °• Allergic reactions, such as hives or itching °• Flu-like signs and symptoms, including chills, fatigue, fever, and muscle aches and pains °• Nausea, vomiting °• Diarrhea °• Skin rashes °• Low blood pressure ° ° °The CDC is recommending patients who receive monoclonal antibody treatments wait at least 90 days before being vaccinated. ° °Currently, there are no data on the safety and efficacy of mRNA COVID-19 vaccines in persons who received monoclonal antibodies or convalescent plasma as part of COVID-19 treatment. Based on the estimated half-life of such therapies as well as evidence suggesting that reinfection is uncommon in the 90 days after initial infection, vaccination should be deferred for at least 90 days, as a precautionary measure until additional information becomes available, to avoid interference of the antibody treatment with vaccine-induced immune responses. ° ° ° ° ° ° °10 Things You Can Do to Manage Your COVID-19 Symptoms at Home °If you have possible or confirmed COVID-19: °1. Stay home from work and school. And stay away from other public places. If you must go out, avoid using any kind of public transportation, ridesharing, or taxis. °2. Monitor your symptoms carefully. If your symptoms get worse, call your healthcare provider  immediately. °3. Get rest and stay hydrated. °4. If you have a medical appointment, call the healthcare provider ahead of time and tell them that you have or may have COVID-19. °5. For medical emergencies, call 911 and notify the dispatch personnel that you have or may have COVID-19. °6. Cover your cough and sneezes with a tissue or use the inside of your elbow. °7. Wash your hands often with soap and water for at least 20 seconds or clean your hands with an alcohol-based hand sanitizer that contains at least 60% alcohol. °8. As much as possible, stay in a specific room and away from other people in your home. Also, you should use a separate bathroom, if available. If you need to be around other people in or outside of the home, wear a mask. °9. Avoid sharing personal items with other people in your household, like dishes, towels, and bedding. °10. Clean all surfaces that are touched often, like counters, tabletops, and doorknobs. Use household cleaning sprays or wipes according to the label instructions. °cdc.gov/coronavirus °09/21/2018 °This information is not intended to replace advice given to you by your health care provider. Make sure you discuss any questions you have with your health care provider. °Document Revised: 02/23/2019 Document Reviewed: 02/23/2019 °Elsevier Patient Education © 2020 [employer] ° °

## 2020-03-12 NOTE — Progress Notes (Signed)
  Diagnosis: COVID-19  Physician: Dr. Wright  Procedure: Covid Infusion Clinic Med: bamlanivimab\etesevimab infusion - Provided patient with bamlanimivab\etesevimab fact sheet for patients, parents and caregivers prior to infusion.  Complications: No immediate complications noted.  Discharge: Discharged home   Monia Timmers R Raseel Jans 03/12/2020    

## 2020-03-18 ENCOUNTER — Telehealth: Payer: Self-pay

## 2020-03-18 NOTE — Telephone Encounter (Signed)
Pt called in and stated that she tested positive for Covid and she is on day 8 of her quarantine. The pt said that she cant keep anything down and she hasn't ate in almost 3 days. The pt is requesting a call back from the nurse , she doesn't know what to do, go to the ED or can a nurse tell her what she can do. Please advise

## 2020-03-19 ENCOUNTER — Emergency Department
Admission: EM | Admit: 2020-03-19 | Discharge: 2020-03-19 | Disposition: A | Discharge status: Home / Self Care | Payer: Managed Care, Other (non HMO) | Attending: Emergency Medicine | Admitting: Emergency Medicine

## 2020-03-19 ENCOUNTER — Other Ambulatory Visit: Payer: Self-pay

## 2020-03-19 DIAGNOSIS — Z3A17 17 weeks gestation of pregnancy: Secondary | ICD-10-CM | POA: Diagnosis not present

## 2020-03-19 DIAGNOSIS — F17290 Nicotine dependence, other tobacco product, uncomplicated: Secondary | ICD-10-CM | POA: Diagnosis not present

## 2020-03-19 DIAGNOSIS — O99332 Smoking (tobacco) complicating pregnancy, second trimester: Secondary | ICD-10-CM | POA: Insufficient documentation

## 2020-03-19 DIAGNOSIS — O219 Vomiting of pregnancy, unspecified: Secondary | ICD-10-CM

## 2020-03-19 DIAGNOSIS — J45909 Unspecified asthma, uncomplicated: Secondary | ICD-10-CM | POA: Insufficient documentation

## 2020-03-19 DIAGNOSIS — O99512 Diseases of the respiratory system complicating pregnancy, second trimester: Secondary | ICD-10-CM | POA: Insufficient documentation

## 2020-03-19 LAB — COMPREHENSIVE METABOLIC PANEL
ALT: 195 U/L — ABNORMAL HIGH (ref 0–44)
AST: 94 U/L — ABNORMAL HIGH (ref 15–41)
Albumin: 3.6 g/dL (ref 3.5–5.0)
Alkaline Phosphatase: 59 U/L (ref 38–126)
Anion gap: 11 (ref 5–15)
BUN: 8 mg/dL (ref 6–20)
CO2: 23 mmol/L (ref 22–32)
Calcium: 9.4 mg/dL (ref 8.9–10.3)
Chloride: 102 mmol/L (ref 98–111)
Creatinine, Ser: 0.44 mg/dL (ref 0.44–1.00)
GFR, Estimated: >60 mL/min (ref >60)
Glucose, Bld: 75 mg/dL (ref 70–99)
Potassium: 3.5 mmol/L (ref 3.5–5.1)
Sodium: 136 mmol/L (ref 135–145)
Total Bilirubin: 1.2 mg/dL (ref 0.3–1.2)
Total Protein: 7.5 g/dL (ref 6.5–8.1)

## 2020-03-19 LAB — URINALYSIS, COMPLETE (UACMP) WITH MICROSCOPIC
Bilirubin Urine: NEGATIVE
Glucose, UA: NEGATIVE mg/dL
Hgb urine dipstick: NEGATIVE
Ketones, ur: 80 mg/dL — AB
Nitrite: NEGATIVE
Protein, ur: 100 mg/dL — AB
Specific Gravity, Urine: 1.029 (ref 1.005–1.030)
pH: 5 (ref 5.0–8.0)

## 2020-03-19 LAB — CBC
HCT: 39.1 % (ref 36.0–46.0)
Hemoglobin: 13.5 g/dL (ref 12.0–15.0)
MCH: 30.5 pg (ref 26.0–34.0)
MCHC: 34.5 g/dL (ref 30.0–36.0)
MCV: 88.5 fL (ref 80.0–100.0)
Platelets: 260 10*3/uL (ref 150–400)
RBC: 4.42 MIL/uL (ref 3.87–5.11)
RDW: 12.5 % (ref 11.5–15.5)
WBC: 9.3 10*3/uL (ref 4.0–10.5)
nRBC: 0 % (ref 0.0–0.2)

## 2020-03-19 LAB — LIPASE, BLOOD: Lipase: 37 U/L (ref 11–51)

## 2020-03-19 MED ORDER — SODIUM CHLORIDE 0.9 % IV SOLN
1000.0000 mL | Freq: Once | INTRAVENOUS | Status: AC
  Administered 2020-03-19: 1000 mL via INTRAVENOUS

## 2020-03-19 MED ORDER — ONDANSETRON HCL 4 MG/2ML IJ SOLN
4.0000 mg | Freq: Once | INTRAMUSCULAR | Status: AC
  Administered 2020-03-19: 4 mg via INTRAVENOUS
  Filled 2020-03-19: qty 2
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
## 2020-03-19 NOTE — Telephone Encounter (Signed)
Spoke to pt concerning her call the office. Pt was asked if she was taking her Zofran and was it helping. Pt stated that it was not helping.  Pt was informed that if she was vomiting more than 6 times a day that she would need to go to the ED. Pt was asking if would did IV to help with dehydration. Pt was informed that she would have to be seen at the ED for that. Pt voiced that she understood.

## 2020-03-19 NOTE — ED Triage Notes (Addendum)
Pt reports that she is [redacted] weeks pregnant and has been vomiting states that it started around Christmas Pt adds that she was tested for covid 2 sundays ago and was positive, states that she is on day 9 of her quarantine

## 2020-03-20 ENCOUNTER — Encounter: Payer: Managed Care, Other (non HMO) | Admitting: Obstetrics and Gynecology

## 2020-03-21 ENCOUNTER — Telehealth: Payer: Self-pay

## 2020-03-21 NOTE — Telephone Encounter (Signed)
Called pt [facility] informed pt we need a cody of her insurance card at her next visit

## 2020-03-23 NOTE — L&D Delivery Note (Signed)
       Delivery Note   Carly Williams is a 20 y.o. G1P0000 at [redacted]w[redacted]d Estimated Date of Delivery: 08/22/20  PRE-OPERATIVE DIAGNOSIS:  1) [redacted]w[redacted]d pregnancy.  2) labor  POST-OPERATIVE DIAGNOSIS:  1) [redacted]w[redacted]d pregnancy s/p Vaginal, Spontaneous  2) viable infant   Delivery Type: Vaginal, Spontaneous    Delivery Anesthesia: Epidural   Labor Complications:      ESTIMATED BLOOD LOSS: 220  ml    FINDINGS:   1) female infant, Apgar scores of 8   at 1 minute and 9   at 5 minutes and a birthweight of   ounces.    2) Nuchal cord: no   SPECIMENS:   PLACENTA:   Appearance: Intact    Removal: Spontaneous      Disposition:    DISPOSITION:  Infant to left in stable condition in the delivery room, with L&D personnel and mother,  NARRATIVE SUMMARY: Labor course:  Carly Williams is a G1P0000 at [redacted]w[redacted]d who presented for labor management.  She progressed slowly in labor.  AROM was performed.  Her labor was augmented with pitocin.  She received the appropriate anesthesia and proceeded to complete dilation. She evidenced good maternal expulsive effort during the second stage. She went on to deliver a viable infant. The placenta delivered without problems and was noted to be complete. A perineal and vaginal examination was performed. Episiotomy/Lacerations: 2nd degree;Labial  (right) Episiotomy or lacerations were repaired with Vicryl suture using local anesthesia. The patient tolerated this well.  Elonda Husky, M.D. 08/25/2020 4:36 PM

## 2020-03-25 NOTE — Patient Instructions (Signed)
WHAT OB PATIENTS CAN EXPECT   Confirmation of pregnancy and ultrasound ordered if medically indicated-[redacted] weeks gestation  New OB (NOB) intake with nurse and New OB (NOB) labs- [redacted] weeks gestation  New OB (NOB) physical examination with provider- 11/[redacted] weeks gestation  Flu vaccine-[redacted] weeks gestation  Anatomy scan-[redacted] weeks gestation  Glucose tolerance test, blood work to test for anemia, T-dap vaccine-[redacted] weeks gestation  Vaginal swabs/cultures-STD/Group B strep-[redacted] weeks gestation  Appointments every 4 weeks until 28 weeks  Every 2 weeks from 28 weeks until 36 weeks  Weekly visits from 36 weeks until delivery  Common Medications Safe in Pregnancy  Acne:      Constipation:  Benzoyl Peroxide     Colace  Clindamycin      Dulcolax Suppository  Topica Erythromycin     Fibercon  Salicylic Acid      Metamucil         Miralax AVOID:        Senakot   Accutane    Cough:  Retin-A       Cough Drops  Tetracycline      Phenergan w/ Codeine if Rx  Minocycline      Robitussin (Plain & DM)  Antibiotics:     Crabs/Lice:  Ceclor       RID  Cephalosporins    AVOID:  E-Mycins      Kwell  Keflex  Macrobid/Macrodantin   Diarrhea:  Penicillin      Kao-Pectate  Zithromax      Imodium AD         PUSH FLUIDS AVOID:       Cipro     Fever:  Tetracycline      Tylenol (Regular or Extra  Minocycline       Strength)  Levaquin      Extra Strength-Do not          Exceed 8 tabs/24 hrs Caffeine:        <256m/day (equiv. To 1 cup of coffee or  approx. 3 12 oz sodas)         Gas: Cold/Hayfever:       Gas-X  Benadryl      Mylicon  Claritin       Phazyme  **Claritin-D        Chlor-Trimeton    Headaches:  Dimetapp      ASA-Free Excedrin  Drixoral-Non-Drowsy     Cold Compress  Mucinex (Guaifenasin)     Tylenol (Regular or Extra  Sudafed/Sudafed-12 Hour     Strength)  **Sudafed PE Pseudoephedrine   Tylenol Cold & Sinus     Vicks Vapor Rub  Zyrtec  **AVOID if Problems With Blood  Pressure         Heartburn: Avoid lying down for at least 1 hour after meals  Aciphex      Maalox     Rash:  Milk of Magnesia     Benadryl    Mylanta       1% Hydrocortisone Cream  Pepcid  Pepcid Complete   Sleep Aids:  Prevacid      Ambien   Prilosec       Benadryl  Rolaids       Chamomile Tea  Tums (Limit 4/day)     Unisom         Tylenol PM         Warm milk-add vanilla or  Hemorrhoids:       Sugar for taste  Anusol/Anusol H.C.  (RX: Analapram 2.5%)  Sugar Substitutes:  Hydrocortisone OTC     Ok in moderation  Preparation H      Tucks        Vaseline lotion applied to tissue with wiping    Herpes:     Throat:  Acyclovir      Oragel  Famvir  Valtrex     Vaccines:         Flu Shot Leg Cramps:       *Gardasil  Benadryl      Hepatitis A         Hepatitis B Nasal Spray:       Pneumovax  Saline Nasal Spray     Polio Booster         Tetanus Nausea:       Tuberculosis test or PPD  Vitamin B6 25 mg TID   AVOID:    Dramamine      *Gardasil  Emetrol       Live Poliovirus  Ginger Root 250 mg QID    MMR (measles, mumps &  High Complex Carbs @ Bedtime    rebella)  Sea Bands-Accupressure    Varicella (Chickenpox)  Unisom 1/2 tab TID     *No known complications           If received before Pain:         Known pregnancy;   Darvocet       Resume series after  Lortab        Delivery  Percocet    Yeast:   Tramadol      Femstat  Tylenol 3      Gyne-lotrimin  Ultram       Monistat  Vicodin           MISC:         All Sunscreens           Hair Coloring/highlights          Insect Repellant's          (Including DEET)         Mystic Tans Breastfeeding  Choosing to breastfeed is one of the best decisions you can make for yourself and your baby. A change in hormones during pregnancy causes your breasts to make breast milk in your milk-producing glands. Hormones prevent breast milk from being released before your baby is born. They also prompt milk flow after birth. Once  breastfeeding has begun, thoughts of your baby, as well as his or her sucking or crying, can stimulate the release of milk from your milk-producing glands. Benefits of breastfeeding Research shows that breastfeeding offers many health benefits for infants and mothers. It also offers a cost-free and convenient way to feed your baby. For your baby  Your first milk (colostrum) helps your baby's digestive system to function better.  Special cells in your milk (antibodies) help your baby to fight off infections.  Breastfed babies are less likely to develop asthma, allergies, obesity, or type 2 diabetes. They are also at lower risk for sudden infant death syndrome (SIDS).  Nutrients in breast milk are better able to meet your baby's needs compared to infant formula.  Breast milk improves your baby's brain development. For you  Breastfeeding helps to create a very special bond between you and your baby.  Breastfeeding is convenient. Breast milk costs nothing and is always available at the correct temperature.  Breastfeeding helps to burn calories. It helps you to lose the weight that you gained during pregnancy.  Breastfeeding  makes your uterus return faster to its size before pregnancy. It also slows bleeding (lochia) after you give birth.  Breastfeeding helps to lower your risk of developing type 2 diabetes, osteoporosis, rheumatoid arthritis, cardiovascular disease, and breast, ovarian, uterine, and endometrial cancer later in life. Breastfeeding basics Starting breastfeeding  Find a comfortable place to sit or lie down, with your neck and back well-supported.  Place a pillow or a rolled-up blanket under your baby to bring him or her to the level of your breast (if you are seated). Nursing pillows are specially designed to help support your arms and your baby while you breastfeed.  Make sure that your baby's tummy (abdomen) is facing your abdomen.  Gently massage your breast. With your  fingertips, massage from the outer edges of your breast inward toward the nipple. This encourages milk flow. If your milk flows slowly, you may need to continue this action during the feeding.  Support your breast with 4 fingers underneath and your thumb above your nipple (make the letter "C" with your hand). Make sure your fingers are well away from your nipple and your baby's mouth.  Stroke your baby's lips gently with your finger or nipple.  When your baby's mouth is open wide enough, quickly bring your baby to your breast, placing your entire nipple and as much of the areola as possible into your baby's mouth. The areola is the colored area around your nipple. ? More areola should be visible above your baby's upper lip than below the lower lip. ? Your baby's lips should be opened and extended outward (flanged) to ensure an adequate, comfortable latch. ? Your baby's tongue should be between his or her lower gum and your breast.  Make sure that your baby's mouth is correctly positioned around your nipple (latched). Your baby's lips should create a seal on your breast and be turned out (everted).  It is common for your baby to suck about 2-3 minutes in order to start the flow of breast milk. Latching Teaching your baby how to latch onto your breast properly is very important. An improper latch can cause nipple pain, decreased milk supply, and poor weight gain in your baby. Also, if your baby is not latched onto your nipple properly, he or she may swallow some air during feeding. This can make your baby fussy. Burping your baby when you switch breasts during the feeding can help to get rid of the air. However, teaching your baby to latch on properly is still the best way to prevent fussiness from swallowing air while breastfeeding. Signs that your baby has successfully latched onto your nipple  Silent tugging or silent sucking, without causing you pain. Infant's lips should be extended outward  (flanged).  Swallowing heard between every 3-4 sucks once your milk has started to flow (after your let-down milk reflex occurs).  Muscle movement above and in front of his or her ears while sucking. Signs that your baby has not successfully latched onto your nipple  Sucking sounds or smacking sounds from your baby while breastfeeding.  Nipple pain. If you think your baby has not latched on correctly, slip your finger into the corner of your baby's mouth to break the suction and place it between your baby's gums. Attempt to start breastfeeding again. Signs of successful breastfeeding Signs from your baby  Your baby will gradually decrease the number of sucks or will completely stop sucking.  Your baby will fall asleep.  Your baby's body will relax.  Your baby will retain a small amount of milk in his or her mouth.  Your baby will let go of your breast by himself or herself. Signs from you  Breasts that have increased in firmness, weight, and size 1-3 hours after feeding.  Breasts that are softer immediately after breastfeeding.  Increased milk volume, as well as a change in milk consistency and color by the fifth day of breastfeeding.  Nipples that are not sore, cracked, or bleeding. Signs that your baby is getting enough milk  Wetting at least 1-2 diapers during the first 24 hours after birth.  Wetting at least 5-6 diapers every 24 hours for the first week after birth. The urine should be clear or pale yellow by the age of 5 days.  Wetting 6-8 diapers every 24 hours as your baby continues to grow and develop.  At least 3 stools in a 24-hour period by the age of 5 days. The stool should be soft and yellow.  At least 3 stools in a 24-hour period by the age of 7 days. The stool should be seedy and yellow.  No loss of weight greater than 10% of birth weight during the first 3 days of life.  Average weight gain of 4-7 oz (113-198 g) per week after the age of 4  days.  Consistent daily weight gain by the age of 5 days, without weight loss after the age of 2 weeks. After a feeding, your baby may spit up a small amount of milk. This is normal. Breastfeeding frequency and duration Frequent feeding will help you make more milk and can prevent sore nipples and extremely full breasts (breast engorgement). Breastfeed when you feel the need to reduce the fullness of your breasts or when your baby shows signs of hunger. This is called "breastfeeding on demand." Signs that your baby is hungry include:  Increased alertness, activity, or restlessness.  Movement of the head from side to side.  Opening of the mouth when the corner of the mouth or cheek is stroked (rooting).  Increased sucking sounds, smacking lips, cooing, sighing, or squeaking.  Hand-to-mouth movements and sucking on fingers or hands.  Fussing or crying. Avoid introducing a pacifier to your baby in the first 4-6 weeks after your baby is born. After this time, you may choose to use a pacifier. Research has shown that pacifier use during the first year of a baby's life decreases the risk of sudden infant death syndrome (SIDS). Allow your baby to feed on each breast as long as he or she wants. When your baby unlatches or falls asleep while feeding from the first breast, offer the second breast. Because newborns are often sleepy in the first few weeks of life, you may need to awaken your baby to get him or her to feed. Breastfeeding times will vary from baby to baby. However, the following rules can serve as a guide to help you make sure that your baby is properly fed:  Newborns (babies 38 weeks of age or younger) may breastfeed every 1-3 hours.  Newborns should not go without breastfeeding for longer than 3 hours during the day or 5 hours during the night.  You should breastfeed your baby a minimum of 8 times in a 24-hour period. Breast milk pumping     Pumping and storing breast milk allows  you to make sure that your baby is exclusively fed your breast milk, even at times when you are unable to breastfeed. This is especially important if  you go back to work while you are still breastfeeding, or if you are not able to be present during feedings. Your lactation consultant can help you find a method of pumping that works best for you and give you guidelines about how long it is safe to store breast milk. Caring for your breasts while you breastfeed Nipples can become dry, cracked, and sore while breastfeeding. The following recommendations can help keep your breasts moisturized and healthy:  Avoid using soap on your nipples.  Wear a supportive bra designed especially for nursing. Avoid wearing underwire-style bras or extremely tight bras (sports bras).  Air-dry your nipples for 3-4 minutes after each feeding.  Use only cotton bra pads to absorb leaked breast milk. Leaking of breast milk between feedings is normal.  Use lanolin on your nipples after breastfeeding. Lanolin helps to maintain your skin's normal moisture barrier. Pure lanolin is not harmful (not toxic) to your baby. You may also hand express a few drops of breast milk and gently massage that milk into your nipples and allow the milk to air-dry. In the first few weeks after giving birth, some women experience breast engorgement. Engorgement can make your breasts feel heavy, warm, and tender to the touch. Engorgement peaks within 3-5 days after you give birth. The following recommendations can help to ease engorgement:  Completely empty your breasts while breastfeeding or pumping. You may want to start by applying warm, moist heat (in the shower or with warm, water-soaked hand towels) just before feeding or pumping. This increases circulation and helps the milk flow. If your baby does not completely empty your breasts while breastfeeding, pump any extra milk after he or she is finished.  Apply ice packs to your breasts  immediately after breastfeeding or pumping, unless this is too uncomfortable for you. To do this: ? Put ice in a plastic bag. ? Place a towel between your skin and the bag. ? Leave the ice on for 20 minutes, 2-3 times a day.  Make sure that your baby is latched on and positioned properly while breastfeeding. If engorgement persists after 48 hours of following these recommendations, contact your health care provider or a Science writer. Overall health care recommendations while breastfeeding  Eat 3 healthy meals and 3 snacks every day. Well-nourished mothers who are breastfeeding need an additional 450-500 calories a day. You can meet this requirement by increasing the amount of a balanced diet that you eat.  Drink enough water to keep your urine pale yellow or clear.  Rest often, relax, and continue to take your prenatal vitamins to prevent fatigue, stress, and low vitamin and mineral levels in your body (nutrient deficiencies).  Do not use any products that contain nicotine or tobacco, such as cigarettes and e-cigarettes. Your baby may be harmed by chemicals from cigarettes that pass into breast milk and exposure to secondhand smoke. If you need help quitting, ask your health care provider.  Avoid alcohol.  Do not use illegal drugs or marijuana.  Talk with your health care provider before taking any medicines. These include over-the-counter and prescription medicines as well as vitamins and herbal supplements. Some medicines that may be harmful to your baby can pass through breast milk.  It is possible to become pregnant while breastfeeding. If birth control is desired, ask your health care provider about options that will be safe while breastfeeding your baby. Where to find more information: [employer]: www.llli.org Contact a health care provider if:  You feel like  you want to stop breastfeeding or have become frustrated with breastfeeding.  Your nipples are  cracked or bleeding.  Your breasts are red, tender, or warm.  You have: ? Painful breasts or nipples. ? A swollen area on either breast. ? A fever or chills. ? Nausea or vomiting. ? Drainage other than breast milk from your nipples.  Your breasts do not become full before feedings by the fifth day after you give birth.  You feel sad and depressed.  Your baby is: ? Too sleepy to eat well. ? Having trouble sleeping. ? More than 52 week old and wetting fewer than 6 diapers in a 24-hour period. ? Not gaining weight by 83 days of age.  Your baby has fewer than 3 stools in a 24-hour period.  Your baby's skin or the white parts of his or her eyes become yellow. Get help right away if:  Your baby is overly tired (lethargic) and does not want to wake up and feed.  Your baby develops an unexplained fever. Summary  Breastfeeding offers many health benefits for infant and mothers.  Try to breastfeed your infant when he or she shows early signs of hunger.  Gently tickle or stroke your baby's lips with your finger or nipple to allow the baby to open his or her mouth. Bring the baby to your breast. Make sure that much of the areola is in your baby's mouth. Offer one side and burp the baby before you offer the other side.  Talk with your health care provider or lactation consultant if you have questions or you face problems as you breastfeed. This information is not intended to replace advice given to you by your health care provider. Make sure you discuss any questions you have with your health care provider. Document Revised: 06/03/2017 Document Reviewed: 04/10/2016 Elsevier Patient Education  [place] of Pregnancy  The second trimester is from week 14 through week 27 (month 4 through 6). This is often the time in pregnancy that you feel your best. Often times, morning sickness has lessened or quit. You may have more energy, and you may get hungry more often.  Your unborn baby is growing rapidly. At the end of the sixth month, he or she is about 9 inches long and weighs about 1 pounds. You will likely feel the baby move between 18 and 20 weeks of pregnancy. Follow these instructions at home: Medicines  Take over-the-counter and prescription medicines only as told by your doctor. Some medicines are safe and some medicines are not safe during pregnancy.  Take a prenatal vitamin that contains at least 600 micrograms (mcg) of folic acid.  If you have trouble pooping (constipation), take medicine that will make your stool soft (stool softener) if your doctor approves. Eating and drinking   Eat regular, healthy meals.  Avoid raw meat and uncooked cheese.  If you get low calcium from the food you eat, talk to your doctor about taking a daily calcium supplement.  Avoid foods that are high in fat and sugars, such as fried and sweet foods.  If you feel sick to your stomach (nauseous) or throw up (vomit): ? Eat 4 or 5 small meals a day instead of 3 large meals. ? Try eating a few soda crackers. ? Drink liquids between meals instead of during meals.  To prevent constipation: ? Eat foods that are high in fiber, like fresh fruits and vegetables, whole grains, and beans. ? Drink enough fluids to  keep your pee (urine) clear or pale yellow. Activity  Exercise only as told by your doctor. Stop exercising if you start to have cramps.  Do not exercise if it is too hot, too humid, or if you are in a place of great height (high altitude).  Avoid heavy lifting.  Wear low-heeled shoes. Sit and stand up straight.  You can continue to have sex unless your doctor tells you not to. Relieving pain and discomfort  Wear a good support bra if your breasts are tender.  Take warm water baths (sitz baths) to soothe pain or discomfort caused by hemorrhoids. Use hemorrhoid cream if your doctor approves.  Rest with your legs raised if you have leg cramps or low  back pain.  If you develop puffy, bulging veins (varicose veins) in your legs: ? Wear support hose or compression stockings as told by your doctor. ? Raise (elevate) your feet for 15 minutes, 3-4 times a day. ? Limit salt in your food. Prenatal care  Write down your questions. Take them to your prenatal visits.  Keep all your prenatal visits as told by your doctor. This is important. Safety  Wear your seat belt when driving.  Make a list of emergency phone numbers, including numbers for family, friends, the hospital, and police and fire departments. General instructions  Ask your doctor about the right foods to eat or for help finding a counselor, if you need these services.  Ask your doctor about local prenatal classes. Begin classes before month 6 of your pregnancy.  Do not use hot tubs, steam rooms, or saunas.  Do not douche or use tampons or scented sanitary pads.  Do not cross your legs for long periods of time.  Visit your dentist if you have not done so. Use a soft toothbrush to brush your teeth. Floss gently.  Avoid all smoking, herbs, and alcohol. Avoid drugs that are not approved by your doctor.  Do not use any products that contain nicotine or tobacco, such as cigarettes and e-cigarettes. If you need help quitting, ask your doctor.  Avoid cat litter boxes and soil used by cats. These carry germs that can cause birth defects in the baby and can cause a loss of your baby (miscarriage) or stillbirth. Contact a doctor if:  You have mild cramps or pressure in your lower belly.  You have pain when you pee (urinate).  You have bad smelling fluid coming from your vagina.  You continue to feel sick to your stomach (nauseous), throw up (vomit), or have watery poop (diarrhea).  You have a nagging pain in your belly area.  You feel dizzy. Get help right away if:  You have a fever.  You are leaking fluid from your vagina.  You have spotting or bleeding from your  vagina.  You have severe belly cramping or pain.  You lose or gain weight rapidly.  You have trouble catching your breath and have chest pain.  You notice sudden or extreme puffiness (swelling) of your face, hands, ankles, feet, or legs.  You have not felt the baby move in over an hour.  You have severe headaches that do not go away when you take medicine.  You have trouble seeing. Summary  The second trimester is from week 14 through week 27 (months 4 through 6). This is often the time in pregnancy that you feel your best.  To take care of yourself and your unborn baby, you will need to eat healthy meals, take  medicines only if your doctor tells you to do so, and do activities that are safe for you and your baby.  Call your doctor if you get sick or if you notice anything unusual about your pregnancy. Also, call your doctor if you need help with the right food to eat, or if you want to know what activities are safe for you. This information is not intended to replace advice given to you by your health care provider. Make sure you discuss any questions you have with your health care provider. Document Revised: 07/01/2018 Document Reviewed: 04/14/2016 Elsevier Patient Education  [place].

## 2020-03-26 ENCOUNTER — Encounter: Payer: Self-pay | Admitting: Obstetrics and Gynecology

## 2020-03-26 ENCOUNTER — Ambulatory Visit (INDEPENDENT_AMBULATORY_CARE_PROVIDER_SITE_OTHER): Payer: Managed Care, Other (non HMO) | Admitting: Obstetrics and Gynecology

## 2020-03-26 ENCOUNTER — Other Ambulatory Visit: Payer: Self-pay

## 2020-03-26 VITALS — BP 103/66 | HR 94 | Wt 170.0 lb

## 2020-03-26 DIAGNOSIS — U071 COVID-19: Secondary | ICD-10-CM

## 2020-03-26 DIAGNOSIS — Z1379 Encounter for other screening for genetic and chromosomal anomalies: Secondary | ICD-10-CM

## 2020-03-26 DIAGNOSIS — Z3402 Encounter for supervision of normal first pregnancy, second trimester: Secondary | ICD-10-CM

## 2020-03-26 DIAGNOSIS — Z8659 Personal history of other mental and behavioral disorders: Secondary | ICD-10-CM

## 2020-03-26 DIAGNOSIS — R42 Dizziness and giddiness: Secondary | ICD-10-CM

## 2020-03-26 DIAGNOSIS — O99512 Diseases of the respiratory system complicating pregnancy, second trimester: Secondary | ICD-10-CM

## 2020-03-26 DIAGNOSIS — Z3A18 18 weeks gestation of pregnancy: Secondary | ICD-10-CM

## 2020-03-26 DIAGNOSIS — O99513 Diseases of the respiratory system complicating pregnancy, third trimester: Secondary | ICD-10-CM | POA: Insufficient documentation

## 2020-03-26 DIAGNOSIS — J45909 Unspecified asthma, uncomplicated: Secondary | ICD-10-CM | POA: Insufficient documentation

## 2020-03-26 LAB — POCT URINALYSIS DIPSTICK OB
Bilirubin, UA: NEGATIVE
Blood, UA: NEGATIVE
Glucose, UA: NEGATIVE
Ketones, UA: NEGATIVE
Leukocytes, UA: NEGATIVE
Nitrite, UA: NEGATIVE
POC,PROTEIN,UA: NEGATIVE
Spec Grav, UA: >=1.03 — AB (ref 1.010–1.025)
Urobilinogen, UA: 0.2 E.U./dL
pH, UA: 5 (ref 5.0–8.0)

## 2020-03-26 NOTE — Progress Notes (Signed)
ROB: Notes some issues with dizziness. Discussed that lower BPs in second trimester can lead to dizziness. Encouraged adequate hydration, slightly increasing sodium intake. Also has questions regarding DNA testing (notes FOB has questions regarding paternity). Informed of locations that perform testing after birth.  Discussed midwifery care vs MD care at patient's request.  Patient notes that she desires natural child birth. Reviewed hospital visitor policy. Also discussed skin to skin and rooming in as patient had questions. After further discussion, will plan to stay in MD care. Given information on doula. Desires handicap sticker for SOB (has hx of asthma, recent COVID-19 infection in November, notes some SOB with minimal exertion or at rest). Has an albuterol inhaler. Will refer to Pulmonology. Reviewed MaterniT21 results, for AFP next visit. History of depression, baseline PHQ-9 is negative.  Will think about flu vaccine, assess next visit. RTC in 1 week for anatomy scan, and 4 weeks for routine OB visit.    The following were addressed during this visit:  Breastfeeding Education - Nonpharmacological pain relief methods for labor    Comments: Deep breathing, focusing on pleasant things, movement and walking, heating pads or cold compress, massage and relaxation, continuous support from someone you trust, and Doulas   - The importance of early skin-to-skin contact    Comments: Keeps baby warm and secure, helps keep baby's blood sugar up and breathing steady, easier to bond and breastfeed, and helps calm baby.  - Rooming-in on a 24-hour basis    Comments: Easier to learn baby's feeding cues, easier to bond and get to know each other, and encourages milk production.

## 2020-03-26 NOTE — Progress Notes (Signed)
ROB-Pt present for routine prenatal care. Pt stated that she was doing well other than when she went to the hospital she was informed that she had an UTI and was given antibiotics.

## 2020-04-01 ENCOUNTER — Ambulatory Visit (INDEPENDENT_AMBULATORY_CARE_PROVIDER_SITE_OTHER): Payer: Managed Care, Other (non HMO) | Admitting: Pulmonary Disease

## 2020-04-01 ENCOUNTER — Encounter: Payer: Self-pay | Admitting: Pulmonary Disease

## 2020-04-01 ENCOUNTER — Other Ambulatory Visit: Payer: Self-pay

## 2020-04-01 ENCOUNTER — Ambulatory Visit: Payer: Managed Care, Other (non HMO)

## 2020-04-01 VITALS — BP 110/64 | HR 96 | Temp 98.0°F | Ht 61.0 in | Wt 170.8 lb

## 2020-04-01 DIAGNOSIS — O99512 Diseases of the respiratory system complicating pregnancy, second trimester: Secondary | ICD-10-CM

## 2020-04-01 DIAGNOSIS — J45909 Unspecified asthma, uncomplicated: Secondary | ICD-10-CM | POA: Diagnosis not present

## 2020-04-01 DIAGNOSIS — R0602 Shortness of breath: Secondary | ICD-10-CM

## 2020-04-01 MED ORDER — BUDESONIDE-FORMOTEROL FUMARATE 80-4.5 MCG/ACT IN AERO
2.0000 | INHALATION_SPRAY | Freq: Two times a day (BID) | RESPIRATORY_TRACT | 6 refills | Status: DC
Start: 2020-04-01 — End: 2021-07-08
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
## 2020-04-01 NOTE — Patient Instructions (Signed)
We will try Symbicort 2 puffs twice a day 80/4.5, 2 puffs twice a day.  Make sure you rinse your mouth well after you use it.  We will see you in follow-up in 4 to 6 weeks time, you may see me or the nurse practitioner.

## 2020-04-02 ENCOUNTER — Encounter: Payer: Managed Care, Other (non HMO) | Admitting: Obstetrics and Gynecology

## 2020-04-02 ENCOUNTER — Other Ambulatory Visit: Payer: Managed Care, Other (non HMO)

## 2020-04-03 ENCOUNTER — Other Ambulatory Visit: Payer: Self-pay | Admitting: Obstetrics and Gynecology

## 2020-04-03 ENCOUNTER — Encounter: Payer: Self-pay | Admitting: Obstetrics and Gynecology

## 2020-04-03 ENCOUNTER — Ambulatory Visit (INDEPENDENT_AMBULATORY_CARE_PROVIDER_SITE_OTHER): Payer: Managed Care, Other (non HMO)

## 2020-04-03 ENCOUNTER — Other Ambulatory Visit: Payer: Self-pay

## 2020-04-03 ENCOUNTER — Ambulatory Visit (INDEPENDENT_AMBULATORY_CARE_PROVIDER_SITE_OTHER): Payer: Managed Care, Other (non HMO) | Admitting: Obstetrics and Gynecology

## 2020-04-03 ENCOUNTER — Other Ambulatory Visit: Payer: Managed Care, Other (non HMO)

## 2020-04-03 VITALS — BP 118/68 | HR 90 | Wt 171.7 lb

## 2020-04-03 DIAGNOSIS — Z3402 Encounter for supervision of normal first pregnancy, second trimester: Secondary | ICD-10-CM | POA: Diagnosis not present

## 2020-04-03 DIAGNOSIS — Z3A19 19 weeks gestation of pregnancy: Secondary | ICD-10-CM

## 2020-04-03 LAB — POCT URINALYSIS DIPSTICK OB
Bilirubin, UA: NEGATIVE
Blood, UA: NEGATIVE
Glucose, UA: NEGATIVE
Ketones, UA: NEGATIVE
Leukocytes, UA: NEGATIVE
Nitrite, UA: NEGATIVE
POC,PROTEIN,UA: NEGATIVE
Spec Grav, UA: 1.01 (ref 1.010–1.025)
Urobilinogen, UA: 0.2 E.U./dL
pH, UA: 6 (ref 5.0–8.0)

## 2020-04-03 NOTE — Progress Notes (Signed)
ROB: Has no complaints today.  Desires AFP -ordered through [facility].  Occasionally feels the baby move.  FAS completed today without issue.

## 2020-04-10 ENCOUNTER — Telehealth: Payer: Self-pay

## 2020-04-10 LAB — AFP, SERUM, OPEN SPINA BIFIDA
AFP MoM: 0.9
AFP Value: 46.6 ng/mL
Gest. Age on Collection Date: 19.9 weeks
Maternal Age At EDD: 21.3 yr
OSBR Risk 1 IN: 10000
Test Results:: NEGATIVE
Weight: 173 [lb_av]

## 2020-04-10 NOTE — Telephone Encounter (Signed)
Patient called in stating that she needs a note from her provider stating that she cannot lift things over a certain weight. 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  Could you please advise?

## 2020-04-10 NOTE — Telephone Encounter (Signed)
Please advise 

## 2020-04-11 ENCOUNTER — Encounter: Payer: Managed Care, Other (non HMO) | Admitting: Obstetrics and Gynecology

## 2020-04-12 NOTE — Telephone Encounter (Signed)
LM for patient to find out if she had a form that her company has to fill out.

## 2020-04-19 NOTE — Telephone Encounter (Signed)
LM for patient to return call.

## 2020-05-01 ENCOUNTER — Encounter: Payer: Self-pay | Admitting: Obstetrics and Gynecology

## 2020-05-01 ENCOUNTER — Ambulatory Visit (INDEPENDENT_AMBULATORY_CARE_PROVIDER_SITE_OTHER): Payer: Managed Care, Other (non HMO) | Admitting: Obstetrics and Gynecology

## 2020-05-01 ENCOUNTER — Other Ambulatory Visit: Payer: Self-pay

## 2020-05-01 ENCOUNTER — Other Ambulatory Visit (HOSPITAL_COMMUNITY)
Admission: RE | Admit: 2020-05-01 | Discharge: 2020-05-01 | Disposition: A | Discharge status: Home / Self Care | Payer: Managed Care, Other (non HMO) | Source: Ambulatory Visit | Attending: Obstetrics and Gynecology | Admitting: Obstetrics and Gynecology

## 2020-05-01 VITALS — BP 111/64 | HR 90 | Ht 61.0 in | Wt 177.0 lb

## 2020-05-01 DIAGNOSIS — Z3402 Encounter for supervision of normal first pregnancy, second trimester: Secondary | ICD-10-CM

## 2020-05-01 DIAGNOSIS — Z3A23 23 weeks gestation of pregnancy: Secondary | ICD-10-CM | POA: Insufficient documentation

## 2020-05-01 DIAGNOSIS — B373 Candidiasis of vulva and vagina: Secondary | ICD-10-CM | POA: Insufficient documentation

## 2020-05-01 DIAGNOSIS — Z113 Encounter for screening for infections with a predominantly sexual mode of transmission: Secondary | ICD-10-CM | POA: Diagnosis not present

## 2020-05-01 DIAGNOSIS — O98812 Other maternal infectious and parasitic diseases complicating pregnancy, second trimester: Secondary | ICD-10-CM | POA: Diagnosis not present

## 2020-05-01 DIAGNOSIS — R399 Unspecified symptoms and signs involving the genitourinary system: Secondary | ICD-10-CM

## 2020-05-01 DIAGNOSIS — O23592 Infection of other part of genital tract in pregnancy, second trimester: Secondary | ICD-10-CM | POA: Insufficient documentation

## 2020-05-01 LAB — POCT URINALYSIS DIPSTICK OB
Bilirubin, UA: NEGATIVE
Blood, UA: NEGATIVE
Glucose, UA: NEGATIVE
Ketones, UA: NEGATIVE
Nitrite, UA: NEGATIVE
POC,PROTEIN,UA: NEGATIVE
Spec Grav, UA: >=1.03 — AB (ref 1.010–1.025)
Urobilinogen, UA: 0.2 E.U./dL
pH, UA: 6 (ref 5.0–8.0)

## 2020-05-01 NOTE — Progress Notes (Signed)
ROB: Patient feels like she may have a UTI, feels urinary urgency and feels like incomplete emptying.  Desires STI screening as she has recent become involved again with FOB. DOing much better on Symbicort for asthma, but notes price may be cost-prohibitive eventually ($60 per month). Advised to discuss with Pulmonologist.  Answered all patient's general questions about pregnancy. Discussed breastfeeding. RTC in 4 weeks, for 28 week labs.

## 2020-05-01 NOTE — Progress Notes (Signed)
ROB-Pt present for routine prenatal carePt stated that she was doing well other than would like to have STD screenings.

## 2020-05-01 NOTE — Patient Instructions (Addendum)
GESTATIONAL DIABETES TESTING FOR PREGNANCY  Pregnant women can develop a condition known as Gestational Diabetes (diabetes brought on by pregnancy) which can pose a risk to both mother and baby. A glucose tolerance test is a common type of testing for potential gestational diabetes.  There are several tests intended to identify gestational diabetes in pregnant women. The first, called the Glucose Challenge Screening, is a preliminary screening test performed between 26-28 weeks. If a woman tests positive during this screening test, the second test, called the Glucose Tolerance Test, may be performed. This test will diagnose whether diabetes exists or not by indicating whether or not the body is using glucose (a type of sugar) effectively.   The Glucose Challenge Screening is now considered to be a standard test performed during the early part of the third trimester of pregnancy.  What is the Glucose Challenge Screening Test? No preparation is required prior to the test. During the test, the mother is asked to drink a sweet liquid (glucose) and then will have blood drawn one hour from having the drink, as blood glucose levels normally peak within one hour. No fasting is required prior to this test.  The test evaluates how your body processes sugar. A high level in your blood may indicate your body is not processing sugar effectively (positive test). If the results of this screen are positive, the woman may have the Glucose Tolerance Test performed. It is important to note that not all women who test positive for the Glucose Challenge Screening test are found to have diabetes upon further diagnosis.  What is the Glucose Tolerance Test? Prior to the taking the glucose tolerance test, your doctor will ask you to make sure and eat at least 150mg  of carbohydrates (about what you will get from a slice or two of bread) for three days prior to the time you will be asked to fast. You will not be permitted to eat  or drink anything but sips of water for 14 hours prior to the test, so it is best to schedule the test for first thing in the morning.  Additionally, you should plan to have someone drive you to and from the test since your energy levels may be low and there is a slight possibility you may feel light-headed.  When you arrive, the technician will draw blood to measure your baseline "fasting blood glucose level". You will be asked to drink a larger volume (or more concentrated solution) of the glucose drink than was used in the initial Glucose Challenge Screening test. Your blood will be drawn and tested every hour for the next three hours.  The following are the values that the American Diabetes Association considers to be abnormal during the Glucose Tolerance Test:  Interval Abnormal reading Fasting 95 mg/dl or higher One hour mg/dl or higher Two hours 660 mg/dl or higher Three hours 630 mg/dl or higher  What if my Glucose Tolerance Test Results are Abnormal? If only one of your readings comes back abnormal, your doctor may suggest some changes to your diet and/or test you again later in the pregnancy. If two or more of your readings come back abnormal, you'll be diagnosed with Gestational Diabetes and your doctor or midwife will talk to you about a treatment plan. Treating diabetes during pregnancy is extremely important to protect the health of both mother and baby.   Compiled using information from the following sources:  1. American Diabetes Association  https://www.diabetes.org  2. Emedicine  https://www.emedicine.com  3. [employer] of Diabetes and Digestive and Kidney Diseases    Second Trimester of Pregnancy  The second trimester of pregnancy is from week 13 through week 27. This is months 4 through 6 of pregnancy. The second trimester is often a time when you feel your best. Your body has adjusted to being pregnant, and you begin to feel better  physically. During the second trimester:  Morning sickness has lessened or stopped completely.  You may have more energy.  You may have an increase in appetite. The second trimester is also a time when the unborn baby (fetus) is growing rapidly. At the end of the sixth month, the fetus may be up to 12 inches long and weigh about 1 pounds. You will likely begin to feel the baby move (quickening) between 16 and 20 weeks of pregnancy. Body changes during your second trimester Your body continues to go through many changes during your second trimester. The changes vary and generally return to normal after the baby is born. Physical changes  Your weight will continue to increase. You will notice your lower abdomen bulging out.  You may begin to get stretch marks on your hips, abdomen, and breasts.  Your breasts will continue to grow and to become tender.  Dark spots or blotches (chloasma or mask of pregnancy) may develop on your face.  A dark line from your belly button to the pubic area (linea nigra) may appear.  You may have changes in your hair. These can include thickening of your hair, rapid growth, and changes in texture. Some people also have hair loss during or after pregnancy, or hair that feels dry or thin. Health changes  You may develop headaches.  You may have heartburn.  You may develop constipation.  You may develop hemorrhoids or swollen, bulging veins (varicose veins).  Your gums may bleed and may be sensitive to brushing and flossing.  You may urinate more often because the fetus is pressing on your bladder.  You may have back pain. This is caused by: ? Weight gain. ? Pregnancy hormones that are relaxing the joints in your pelvis. ? A shift in weight and the muscles that support your balance. Follow these instructions at home: Medicines  Follow your health care provider's instructions regarding medicine use. Specific medicines may be either safe or unsafe to  take during pregnancy. Do not take any medicines unless approved by your health care provider.  Take a prenatal vitamin that contains at least 600 micrograms (mcg) of folic acid. Eating and drinking  Eat a healthy diet that includes fresh fruits and vegetables, whole grains, good sources of protein such as meat, eggs, or tofu, and low-fat dairy products.  Avoid raw meat and unpasteurized juice, milk, and cheese. These carry germs that can harm you and your baby.  You may need to take these actions to prevent or treat constipation: ? Drink enough fluid to keep your urine pale yellow. ? Eat foods that are high in fiber, such as beans, whole grains, and fresh fruits and vegetables. ? Limit foods that are high in fat and processed sugars, such as fried or sweet foods. Activity  Exercise only as directed by your health care provider. Most people can continue their usual exercise routine during pregnancy. Try to exercise for 30 minutes at least 5 days a week. Stop exercising if you develop contractions in your uterus.  Stop exercising if you develop pain or cramping in the lower abdomen or lower back.  Avoid exercising if it is very hot or humid or if you are at a high altitude.  Avoid heavy lifting.  If you choose to, you may have sex unless your health care provider tells you not to. Relieving pain and discomfort  Wear a supportive bra to prevent discomfort from breast tenderness.  Take warm sitz baths to soothe any pain or discomfort caused by hemorrhoids. Use hemorrhoid cream if your health care provider approves.  Rest with your legs raised (elevated) if you have leg cramps or low back pain.  If you develop varicose veins: ? Wear support hose as told by your health care provider. ? Elevate your feet for 15 minutes, 3-4 times a day. ? Limit salt in your diet. Safety  Wear your seat belt at all times when driving or riding in a car.  Talk with your health care provider if someone  is verbally or physically abusive to you. Lifestyle  Do not use hot tubs, steam rooms, or saunas.  Do not douche. Do not use tampons or scented sanitary pads.  Avoid cat litter boxes and soil used by cats. These carry germs that can cause birth defects in the baby and possibly loss of the fetus by miscarriage or stillbirth.  Do not use herbal remedies, alcohol, illegal drugs, or medicines that are not approved by your health care provider. Chemicals in these products can harm your baby.  Do not use any products that contain nicotine or tobacco, such as cigarettes, e-cigarettes, and chewing tobacco. If you need help quitting, ask your health care provider. General instructions  During a routine prenatal visit, your health care provider will do a physical exam and other tests. He or she will also discuss your overall health. Keep all follow-up visits. This is important.  Ask your health care provider for a referral to a local prenatal education class.  Ask for help if you have counseling or nutritional needs during pregnancy. Your health care provider can offer advice or refer you to specialists for help with various needs. Where to find more information  American Pregnancy Association: americanpregnancy.org  [employer] of Obstetricians and Gynecologists: https://www.todd-brady.net/  Office on [employer] [facility]: MightyReward.[employer].nz Contact a health care provider if you have:  A headache that does not go away when you take medicine.  Vision changes or you see spots in front of your eyes.  Mild pelvic cramps, pelvic pressure, or nagging pain in the abdominal area.  Persistent nausea, vomiting, or diarrhea.  A bad-smelling vaginal discharge or foul-smelling urine.  Pain when you urinate.  Sudden or extreme swelling of your face, hands, ankles, feet, or legs.  A fever. Get help right away if you:  Have fluid leaking from your vagina.  Have spotting or  bleeding from your vagina.  Have severe abdominal cramping or pain.  Have difficulty breathing.  Have chest pain.  Have fainting spells.  Have not felt your baby move for the time period told by your health care provider.  Have new or increased pain, swelling, or redness in an arm or leg. Summary  The second trimester of pregnancy is from week 13 through week 27 (months 4 through 6).  Do not use herbal remedies, alcohol, illegal drugs, or medicines that are not approved by your health care provider. Chemicals in these products can harm your baby.  Exercise only as directed by your health care provider. Most people can continue their usual exercise routine during pregnancy.  Keep all follow-up visits. This is  important. This information is not intended to replace advice given to you by your health care provider. Make sure you discuss any questions you have with your health care provider. Document Revised: 08/16/2019 Document Reviewed: 06/22/2019 Elsevier Patient Education  2021 [employer].

## 2020-05-03 ENCOUNTER — Other Ambulatory Visit: Payer: Self-pay | Admitting: Obstetrics and Gynecology

## 2020-05-03 DIAGNOSIS — A749 Chlamydial infection, unspecified: Secondary | ICD-10-CM | POA: Insufficient documentation

## 2020-05-03 DIAGNOSIS — O98812 Other maternal infectious and parasitic diseases complicating pregnancy, second trimester: Secondary | ICD-10-CM | POA: Insufficient documentation

## 2020-05-03 DIAGNOSIS — B379 Candidiasis, unspecified: Secondary | ICD-10-CM

## 2020-05-03 LAB — CERVICOVAGINAL ANCILLARY ONLY
Bacterial Vaginitis (gardnerella): NEGATIVE
Candida Glabrata: NEGATIVE
Candida Vaginitis: POSITIVE — AB
Chlamydia: POSITIVE — AB
Comment: NEGATIVE
Comment: NEGATIVE
Comment: NEGATIVE
Comment: NEGATIVE
Comment: NEGATIVE
Comment: NORMAL
Neisseria Gonorrhea: NEGATIVE
Trichomonas: NEGATIVE

## 2020-05-03 MED ORDER — AZITHROMYCIN 500 MG PO TABS: 1000.0000 mg | ORAL_TABLET | Freq: Once | ORAL | 1 refills | Status: AC

## 2020-05-03 MED ORDER — FLUCONAZOLE 150 MG PO TABS: 150.0000 mg | ORAL_TABLET | Freq: Once | ORAL | 0 refills | Status: AC

## 2020-05-15 ENCOUNTER — Encounter: Payer: Self-pay | Admitting: Obstetrics and Gynecology

## 2020-05-15 ENCOUNTER — Telehealth: Payer: Self-pay

## 2020-05-15 ENCOUNTER — Ambulatory Visit (INDEPENDENT_AMBULATORY_CARE_PROVIDER_SITE_OTHER): Payer: Managed Care, Other (non HMO) | Admitting: Obstetrics and Gynecology

## 2020-05-15 ENCOUNTER — Other Ambulatory Visit: Payer: Self-pay

## 2020-05-15 VITALS — BP 104/71 | HR 89 | Wt 184.0 lb

## 2020-05-15 DIAGNOSIS — Z3402 Encounter for supervision of normal first pregnancy, second trimester: Secondary | ICD-10-CM

## 2020-05-15 DIAGNOSIS — O368120 Decreased fetal movements, second trimester, not applicable or unspecified: Secondary | ICD-10-CM | POA: Diagnosis not present

## 2020-05-15 DIAGNOSIS — Z3A25 25 weeks gestation of pregnancy: Secondary | ICD-10-CM | POA: Diagnosis not present

## 2020-05-15 LAB — POCT URINALYSIS DIPSTICK OB
Bilirubin, UA: NEGATIVE
Blood, UA: NEGATIVE
Glucose, UA: NEGATIVE
Ketones, UA: NEGATIVE
Nitrite, UA: NEGATIVE
POC,PROTEIN,UA: NEGATIVE
Spec Grav, UA: <=1.005 — AB (ref 1.010–1.025)
Urobilinogen, UA: 0.2 E.U./dL
pH, UA: 7 (ref 5.0–8.0)

## 2020-05-15 NOTE — Progress Notes (Signed)
Problem OB Visit: Patient presents for complaints of decreased fetal movement since yesterday evening.  NST performed today was reviewed and was found to be reactive. Given reassurance. Continue recommended antenatal testing and prenatal care.   NONSTRESS TEST INTERPRETATION  INDICATIONS: Decreased fetal movement  FHR baseline: 155 bpm RESULTS:Reactive COMMENTS: Audible active movement detected. No contractions.    PLAN: 1. Advised on fetal kick counts once a day until 28 weeks, then can increase to twice daily. 2. Continue routine prenatal care as scheduled.     Hildred Laser, MD [facility]

## 2020-05-15 NOTE — Progress Notes (Signed)
OB-Pt due to not feeling the baby move for 12 hours. Pt stated having stomach cramping and unsure if it was contractions.

## 2020-05-15 NOTE — Patient Instructions (Addendum)
Fetal Movement Counts Patient Name: ________________________________________________ Patient Due Date: ____________________  What is a fetal movement count? A fetal movement count is the number of times that you feel your baby move during a certain amount of time. This may also be called a fetal kick count. A fetal movement count is recommended for every pregnant woman. You may be asked to start counting fetal movements as early as week 28 of your pregnancy. Pay attention to when your baby is most active. You may notice your baby's sleep and wake cycles. You may also notice things that make your baby move more. You should do a fetal movement count:  When your baby is normally most active.  At the same time each day. A good time to count movements is while you are resting, after having something to eat and drink. How do I count fetal movements? 1. Find a quiet, comfortable area. Sit, or lie down on your side. 2. Write down the date, the start time and stop time, and the number of movements that you felt between those two times. Take this information with you to your health care visits. 3. Write down your start time when you feel the first movement. 4. Count kicks, flutters, swishes, rolls, and jabs. You should feel at least 10 movements. 5. You may stop counting after you have felt 10 movements, or if you have been counting for 2 hours. Write down the stop time. 6. If you do not feel 10 movements in 2 hours, contact your health care provider for further instructions. Your health care provider may want to do additional tests to assess your baby's well-being. Contact a health care provider if:  You feel fewer than 10 movements in 2 hours.  Your baby is not moving like he or she usually does. Date: ____________ Start time: ____________ Stop time: ____________ Movements: ____________ Date: ____________ Start time: ____________ Stop time: ____________ Movements: ____________ Date: ____________  Start time: ____________ Stop time: ____________ Movements: ____________ Date: ____________ Start time: ____________ Stop time: ____________ Movements: ____________ Date: ____________ Start time: ____________ Stop time: ____________ Movements: ____________ Date: ____________ Start time: ____________ Stop time: ____________ Movements: ____________ Date: ____________ Start time: ____________ Stop time: ____________ Movements: ____________ Date: ____________ Start time: ____________ Stop time: ____________ Movements: ____________ Date: ____________ Start time: ____________ Stop time: ____________ Movements: ____________ This information is not intended to replace advice given to you by your health care provider. Make sure you discuss any questions you have with your health care provider. Document Revised: 10/27/2018 Document Reviewed: 10/27/2018 Elsevier Patient Education  2021 Elsevier Inc.  

## 2020-05-16 ENCOUNTER — Encounter: Payer: Self-pay | Admitting: Pulmonary Disease

## 2020-05-16 ENCOUNTER — Ambulatory Visit (INDEPENDENT_AMBULATORY_CARE_PROVIDER_SITE_OTHER): Payer: Managed Care, Other (non HMO) | Admitting: Pulmonary Disease

## 2020-05-16 VITALS — BP 114/72 | HR 99 | Temp 97.1°F | Ht 61.0 in | Wt 185.2 lb

## 2020-05-16 DIAGNOSIS — R0602 Shortness of breath: Secondary | ICD-10-CM | POA: Diagnosis not present

## 2020-05-16 DIAGNOSIS — J45909 Unspecified asthma, uncomplicated: Secondary | ICD-10-CM

## 2020-05-16 DIAGNOSIS — Z3A26 26 weeks gestation of pregnancy: Secondary | ICD-10-CM

## 2020-05-16 DIAGNOSIS — O99512 Diseases of the respiratory system complicating pregnancy, second trimester: Secondary | ICD-10-CM | POA: Diagnosis not present

## 2020-05-16 NOTE — Patient Instructions (Signed)
Continue using Symbicort as you are doing.  We will see you in follow-up in 3 to 4 weeks time with either me or the nurse practitioner.

## 2020-05-16 NOTE — Progress Notes (Signed)
Subjective:    Patient ID: Carly Williams, female    DOB: Sep 16, 2000, 20 y.o.   MRN: [id number]  HPI 20 year old lifelong never smoker currently at 78 weeks of gestation presents for follow-up on the issue of dyspnea in the setting of asthma affecting pregnancy.  She was initially seen on 01 April 2020.  We started her on Symbicort 80/4.5, 2 inhalations twice a day, since starting that medication she has been doing very well.  Her issues with dyspnea have resolved.  She is using albuterol rarely to none.  She feels everything is going well. Her pregnancy is likewise going well.  She voices no active complaint.  Only issues are cost of the inhaler, we have referred her to the [employer] medication assistance.  Review of Systems A 10 point review of systems was performed and it is as noted above otherwise negative.  Patient Active Problem List   Diagnosis Date Noted  . Chlamydia infection affecting pregnancy in second trimester 05/03/2020  . COVID-19 virus infection 03/26/2020  . Asthma affecting pregnancy in second trimester 03/26/2020  . History of depression 03/26/2020  . Obesity, unspecified 06/21/2018  . Supervision of low-risk first pregnancy 06/10/2018  . Asthma 06/10/2018  . Migraine without status migrainosus, not intractable 06/10/2018   Social History   Tobacco Use  . Smoking status: Never Smoker  . Smokeless tobacco: Never Used  Substance Use Topics  . Alcohol use: Not Currently    Alcohol/week: 1.0 standard drink    Types: 1 Glasses of wine per week    Comment: per patient, occasional has a wine cooler   Allergies  Allergen Reactions  . Metronidazole Nausea And Vomiting   Current Meds  Medication Sig  . albuterol (VENTOLIN HFA) 108 (90 Base) MCG/ACT inhaler Inhale 2 puffs into the lungs every 6 (six) hours as needed for wheezing or shortness of breath.  . budesonide-formoterol (SYMBICORT) 80-4.5 MCG/ACT inhaler Inhale 2 puffs into the lungs in the morning  and at bedtime.  . ondansetron (ZOFRAN ODT) 4 MG disintegrating tablet Take 1 tablet (4 mg total) by mouth every 8 (eight) hours as needed for nausea or vomiting.  . Prenatal MV & Min w/FA-DHA (ONE A DAY PRENATAL) 0.4-25 MG CHEW Chew by mouth.  Marland Kitchen scopolamine (TRANSDERM-SCOP, 1.5 MG,) 1 MG/3DAYS Place 1 patch (1.5 mg total) onto the skin every 3 (three) days.       Objective:   Physical Exam BP 114/72 (BP Location: Left Arm, Cuff Size: Normal)   Pulse 99   Temp (!) 97.1 F (36.2 C) (Temporal)   Ht 5\' 1"  (1.549 m)   Wt 185 lb 3.2 oz (84 kg)   LMP 11/16/2019 (Exact Date)   SpO2 98%   BMI 34.99 kg/m  GENERAL: Well-developed woman, gravid, fully ambulatory.  No acute distress. HEAD: Normocephalic, atraumatic.  EYES: Pupils equal, round, reactive to light.  No scleral icterus.  MOUTH: Nose/mouth/throat not examined due to masking requirements for COVID 19. NECK: Supple. No thyromegaly. Trachea midline. No JVD.  No adenopathy. PULMONARY: Good air entry bilaterally.  No adventitious sounds. CARDIOVASCULAR: S1 and S2. Regular rate and rhythm.  No rubs, murmurs or gallops heard. ABDOMEN: Gravid. MUSCULOSKELETAL: No joint deformity, no clubbing, no edema.  NEUROLOGIC: No overt focal deficit, speech is fluent, no gait disturbance. SKIN: Intact,warm,dry.  Limited exam shows no rashes.   PSYCH: Mood and behavior normal.      Assessment & Plan:     ICD-10-CM   1. Asthma affecting pregnancy  in second trimester  O99.512    J45.909    Doing very well on her current regimen Continue Symbicort 80/4.5, 2 inhalations twice a day Continue albuterol as needed  2. Shortness of breath  R06.02    Resolved with the use of Symbicort  3. [redacted] weeks gestation of pregnancy  Z3A.26    Appears to be doing well in this regard This issue adds complexity to her management Followed by OB   Symbicort, she is doing very well.  We will see her in follow-up in 3 to 4 weeks time with either me or the nurse  practitioner.  Gailen Shelter, MD [facility]   *This note was dictated using voice recognition software/Dragon.  Despite best efforts to proofread, errors can occur which can change the meaning.  Any change was purely unintentional.

## 2020-05-22 NOTE — Telephone Encounter (Signed)
Error

## 2020-05-29 ENCOUNTER — Other Ambulatory Visit: Payer: Self-pay

## 2020-05-29 ENCOUNTER — Ambulatory Visit (INDEPENDENT_AMBULATORY_CARE_PROVIDER_SITE_OTHER): Payer: Managed Care, Other (non HMO) | Admitting: Obstetrics and Gynecology

## 2020-05-29 ENCOUNTER — Other Ambulatory Visit: Payer: Managed Care, Other (non HMO)

## 2020-05-29 ENCOUNTER — Encounter: Payer: Managed Care, Other (non HMO) | Admitting: Obstetrics and Gynecology

## 2020-05-29 VITALS — BP 106/70 | HR 87 | Wt 184.2 lb

## 2020-05-29 DIAGNOSIS — Z1379 Encounter for other screening for genetic and chromosomal anomalies: Secondary | ICD-10-CM

## 2020-05-29 DIAGNOSIS — Z23 Encounter for immunization: Secondary | ICD-10-CM | POA: Diagnosis not present

## 2020-05-29 DIAGNOSIS — Z3403 Encounter for supervision of normal first pregnancy, third trimester: Secondary | ICD-10-CM | POA: Diagnosis not present

## 2020-05-29 DIAGNOSIS — A749 Chlamydial infection, unspecified: Secondary | ICD-10-CM

## 2020-05-29 DIAGNOSIS — O98812 Other maternal infectious and parasitic diseases complicating pregnancy, second trimester: Secondary | ICD-10-CM

## 2020-05-29 DIAGNOSIS — Z3A27 27 weeks gestation of pregnancy: Secondary | ICD-10-CM

## 2020-05-29 LAB — POCT URINALYSIS DIPSTICK OB
Bilirubin, UA: NEGATIVE
Blood, UA: NEGATIVE
Glucose, UA: NEGATIVE
Nitrite, UA: NEGATIVE
Odor: NEGATIVE
POC,PROTEIN,UA: NEGATIVE
Spec Grav, UA: 1.02 (ref 1.010–1.025)
Urobilinogen, UA: 0.2 E.U./dL
pH, UA: 6 (ref 5.0–8.0)

## 2020-05-29 MED ORDER — TETANUS-DIPHTH-ACELL PERTUSSIS 5-2.5-18.5 LF-MCG/0.5 IM SUSY: 0.5000 mL | PREFILLED_SYRINGE | Freq: Once | INTRAMUSCULAR | Status: DC

## 2020-05-29 NOTE — Progress Notes (Signed)
ROB: GCT today.  TOC for CT performed.  Questions regarding chlamydia discussed in detail.  Nature of chlamydia and STD discussed.  Patient reports active fetal movement ever since last visit.

## 2020-05-29 NOTE — Patient Instructions (Signed)
Third Trimester of Pregnancy  The third trimester of pregnancy is from week 28 through week 40. This is also called months 7 through 9. This trimester is when your unborn baby (fetus) is growing very fast. At the end of the ninth month, the unborn baby is about 20 inches long. It weighs about 6-10 pounds. Body changes during your third trimester Your body continues to go through many changes during this time. The changes vary and generally return to normal after the baby is born. Physical changes  Your weight will continue to increase. You may gain 25-35 pounds (11-16 kg) by the end of the pregnancy. If you are underweight, you may gain 28-40 lb (about 13-18 kg). If you are overweight, you may gain 15-25 lb (about 7-11 kg).  You may start to get stretch marks on your hips, belly (abdomen), and breasts.  Your breasts will continue to grow and may hurt. A yellow fluid (colostrum) may leak from your breasts. This is the first milk you are making for your baby.  You may have changes in your hair.  Your belly button may stick out.  You may have more swelling in your hands, face, or ankles. Health changes  You may have heartburn.  You may have trouble pooping (constipation).  You may get hemorrhoids. These are swollen veins in the butt that can itch or get painful.  You may have swollen veins (varicose veins) in your legs.  You may have more body aches in the pelvis, back, or thighs.  You may have more tingling or numbness in your hands, arms, and legs. The skin on your belly may also feel numb.  You may feel short of breath as your womb (uterus) gets bigger. Other changes  You may pee (urinate) more often.  You may have more problems sleeping.  You may notice the unborn baby "dropping," or moving lower in your belly.  You may have more discharge coming from your vagina.  Your joints may feel loose, and you may have pain around your pelvic bone. Follow these instructions at  home: Medicines  Take over-the-counter and prescription medicines only as told by your doctor. Some medicines are not safe during pregnancy.  Take a prenatal vitamin that contains at least 600 micrograms (mcg) of folic acid. Eating and drinking  Eat healthy meals that include: ? Fresh fruits and vegetables. ? Whole grains. ? Good sources of protein, such as meat, eggs, or tofu. ? Low-fat dairy products.  Avoid raw meat and unpasteurized juice, milk, and cheese. These carry germs that can harm you and your baby.  Eat 4 or 5 small meals rather than 3 large meals a day.  You may need to take these actions to prevent or treat trouble pooping: ? Drink enough fluids to keep your pee (urine) pale yellow. ? Eat foods that are high in fiber. These include beans, whole grains, and fresh fruits and vegetables. ? Limit foods that are high in fat and sugar. These include fried or sweet foods. Activity  Exercise only as told by your doctor. Stop exercising if you start to have cramps in your womb.  Avoid heavy lifting.  Do not exercise if it is too hot or too humid, or if you are in a place of great height (high altitude).  If you choose to, you may have sex unless your doctor tells you not to. Relieving pain and discomfort  Take breaks often, and rest with your legs raised (elevated) if you have   leg cramps or low back pain.  Take warm water baths (sitz baths) to soothe pain or discomfort caused by hemorrhoids. Use hemorrhoid cream if your doctor approves.  Wear a good support bra if your breasts are tender.  If you develop bulging, swollen veins in your legs: ? Wear support hose as told by your doctor. ? Raise your feet for 15 minutes, 3-4 times a day. ? Limit salt in your food. Safety  Talk to your doctor before traveling far distances.  Do not use hot tubs, steam rooms, or saunas.  Wear your seat belt at all times when you are in a car.  Talk with your doctor if someone is  hurting you or yelling at you a lot. Preparing for your baby's arrival To prepare for the arrival of your baby:  Take prenatal classes.  Visit the hospital and tour the maternity area.  Buy a rear-facing car seat. Learn how to install it in your car.  Prepare the baby's room. Take out all pillows and stuffed animals from the baby's crib. General instructions  Avoid cat litter boxes and soil used by cats. These carry germs that can cause harm to the baby and can cause a loss of your baby by miscarriage or stillbirth.  Do not douche or use tampons. Do not use scented sanitary pads.  Do not smoke or use any products that contain nicotine or tobacco. If you need help quitting, ask your doctor.  Do not drink alcohol.  Do not use herbal medicines, illegal drugs, or medicines that were not approved by your doctor. Chemicals in these products can affect your baby.  Keep all follow-up visits. This is important. Where to find more information  American Pregnancy Association: americanpregnancy.org  [employer]: www.acog.org  Office on Women's Health: KeywordPortfolios.com.br Contact a doctor if:  You have a fever.  You have mild cramps or pressure in your lower belly.  You have a nagging pain in your belly area.  You vomit, or you have watery poop (diarrhea).  You have bad-smelling fluid coming from your vagina.  You have pain when you pee, or your pee smells bad.  You have a headache that does not go away when you take medicine.  You have changes in how you see, or you see spots in front of your eyes. Get help right away if:  Your water breaks.  You have regular contractions that are less than 5 minutes apart.  You are spotting or bleeding from your vagina.  You have very bad belly cramps or pain.  You have trouble breathing.  You have chest pain.  You faint.  You have not felt the baby move for the amount of time told by  your doctor.  You have new or increased pain, swelling, or redness in an arm or leg. Summary  The third trimester is from week 28 through week 40 (months 7 through 9). This is the time when your unborn baby is growing very fast.  During this time, your discomfort may increase as you gain weight and as your baby grows.  Get ready for your baby to arrive by taking prenatal classes, buying a rear-facing car seat, and preparing the baby's room.  Get help right away if you are bleeding from your vagina, you have chest pain and trouble breathing, or you have not felt the baby move for the amount of time told by your doctor. This information is not intended to replace advice given  to you by your health care provider. Make sure you discuss any questions you have with your health care provider. Document Revised: 08/16/2019 Document Reviewed: 06/22/2019 Elsevier Patient Education  [place]. Common Medications Safe in Pregnancy  Acne:      Constipation:  Benzoyl Peroxide     Colace  Clindamycin      Dulcolax Suppository  Topica Erythromycin     Fibercon  Salicylic Acid      Metamucil         Miralax AVOID:        Senakot   Accutane    Cough:  Retin-A       Cough Drops  Tetracycline      Phenergan w/ Codeine if Rx  Minocycline      Robitussin (Plain & DM)  Antibiotics:     Crabs/Lice:  Ceclor       RID  Cephalosporins    AVOID:  E-Mycins      Kwell  Keflex  Macrobid/Macrodantin   Diarrhea:  Penicillin      Kao-Pectate  Zithromax      Imodium AD         PUSH FLUIDS AVOID:       Cipro     Fever:  Tetracycline      Tylenol (Regular or Extra  Minocycline       Strength)  Levaquin      Extra Strength-Do not          Exceed 8 tabs/24 hrs Caffeine:        <216m/day (equiv. To 1 cup of coffee or  approx. 3 12 oz  sodas)         Gas: Cold/Hayfever:       Gas-X  Benadryl      Mylicon  Claritin       Phazyme  **Claritin-D        Chlor-Trimeton    Headaches:  Dimetapp      ASA-Free Excedrin  Drixoral-Non-Drowsy     Cold Compress  Mucinex (Guaifenasin)     Tylenol (Regular or Extra  Sudafed/Sudafed-12 Hour     Strength)  **Sudafed PE Pseudoephedrine   Tylenol Cold & Sinus     Vicks Vapor Rub  Zyrtec  **AVOID if Problems With Blood Pressure         Heartburn: Avoid lying down for at least 1 hour after meals  Aciphex      Maalox     Rash:  Milk of Magnesia     Benadryl    Mylanta       1% Hydrocortisone Cream  Pepcid  Pepcid Complete   Sleep Aids:  Prevacid      Ambien   Prilosec       Benadryl  Rolaids       Chamomile Tea  Tums (Limit 4/day)     Unisom         Tylenol PM         Warm milk-add vanilla or  Hemorrhoids:       Sugar for taste  Anusol/Anusol H.C.  (RX: Analapram 2.5%)  Sugar Substitutes:  Hydrocortisone OTC     Ok in moderation  Preparation H      Tucks        Vaseline lotion applied to tissue with wiping    Herpes:     Throat:  Acyclovir      Oragel  Famvir  Valtrex     Vaccines:  Flu Shot Leg Cramps:       *Gardasil  Benadryl      Hepatitis A         Hepatitis B Nasal Spray:       Pneumovax  Saline Nasal Spray     Polio Booster         Tetanus Nausea:       Tuberculosis test or PPD  Vitamin B6 25 mg TID   AVOID:    Dramamine      *Gardasil  Emetrol       Live Poliovirus  Ginger Root 250 mg QID    MMR (measles, mumps &  High Complex Carbs @ Bedtime    rebella)  Sea Bands-Accupressure    Varicella (Chickenpox)  Unisom 1/2 tab TID     *No known complications           If received before Pain:         Known pregnancy;   Darvocet       Resume series after  Lortab        Delivery  Percocet    Yeast:   Tramadol      Femstat  Tylenol 3      Gyne-lotrimin  Ultram       Monistat  Vicodin           MISC:         All Sunscreens           Hair  Coloring/highlights          Insect Repellant's          (Including DEET)         Mystic Tans Breastfeeding  Choosing to breastfeed is one of the best decisions you can make for yourself and your baby. A change in hormones during pregnancy causes your breasts to make breast milk in your milk-producing glands. Hormones prevent breast milk from being released before your baby is born. They also prompt milk flow after birth. Once breastfeeding has begun, thoughts of your baby, as well as his or her sucking or crying, can stimulate the release of milk from your milk-producing glands. Benefits of breastfeeding Research shows that breastfeeding offers many health benefits for infants and mothers. It also offers a cost-free and convenient way to feed your baby. For your baby  Your first milk (colostrum) helps your baby's digestive system to function better.  Special cells in your milk (antibodies) help your baby to fight off infections.  Breastfed babies are less likely to develop asthma, allergies, obesity, or type 2 diabetes. They are also at lower risk for sudden infant death syndrome (SIDS).  Nutrients in breast milk are better able to meet your baby's needs compared to infant formula.  Breast milk improves your baby's brain development. For you  Breastfeeding helps to create a very special bond between you and your baby.  Breastfeeding is convenient. Breast milk costs nothing and is always available at the correct temperature.  Breastfeeding helps to burn calories. It helps you to lose the weight that you gained during pregnancy.  Breastfeeding makes your uterus return faster to its size before pregnancy. It also slows bleeding (lochia) after you give birth.  Breastfeeding helps to lower your risk of developing type 2 diabetes, osteoporosis, rheumatoid arthritis, cardiovascular disease, and breast, ovarian, uterine, and endometrial cancer later in life. Breastfeeding basics Starting  breastfeeding  Find a comfortable place to sit or lie down, with your neck and back well-supported.  Place  a pillow or a rolled-up blanket under your baby to bring him or her to the level of your breast (if you are seated). Nursing pillows are specially designed to help support your arms and your baby while you breastfeed.  Make sure that your baby's tummy (abdomen) is facing your abdomen.  Gently massage your breast. With your fingertips, massage from the outer edges of your breast inward toward the nipple. This encourages milk flow. If your milk flows slowly, you may need to continue this action during the feeding.  Support your breast with 4 fingers underneath and your thumb above your nipple (make the letter "C" with your hand). Make sure your fingers are well away from your nipple and your baby's mouth.  Stroke your baby's lips gently with your finger or nipple.  When your baby's mouth is open wide enough, quickly bring your baby to your breast, placing your entire nipple and as much of the areola as possible into your baby's mouth. The areola is the colored area around your nipple. ? More areola should be visible above your baby's upper lip than below the lower lip. ? Your baby's lips should be opened and extended outward (flanged) to ensure an adequate, comfortable latch. ? Your baby's tongue should be between his or her lower gum and your breast.  Make sure that your baby's mouth is correctly positioned around your nipple (latched). Your baby's lips should create a seal on your breast and be turned out (everted).  It is common for your baby to suck about 2-3 minutes in order to start the flow of breast milk. Latching Teaching your baby how to latch onto your breast properly is very important. An improper latch can cause nipple pain, decreased milk supply, and poor weight gain in your baby. Also, if your baby is not latched onto your nipple properly, he or she may swallow some air  during feeding. This can make your baby fussy. Burping your baby when you switch breasts during the feeding can help to get rid of the air. However, teaching your baby to latch on properly is still the best way to prevent fussiness from swallowing air while breastfeeding. Signs that your baby has successfully latched onto your nipple  Silent tugging or silent sucking, without causing you pain. Infant's lips should be extended outward (flanged).  Swallowing heard between every 3-4 sucks once your milk has started to flow (after your let-down milk reflex occurs).  Muscle movement above and in front of his or her ears while sucking. Signs that your baby has not successfully latched onto your nipple  Sucking sounds or smacking sounds from your baby while breastfeeding.  Nipple pain. If you think your baby has not latched on correctly, slip your finger into the corner of your baby's mouth to break the suction and place it between your baby's gums. Attempt to start breastfeeding again. Signs of successful breastfeeding Signs from your baby  Your baby will gradually decrease the number of sucks or will completely stop sucking.  Your baby will fall asleep.  Your baby's body will relax.  Your baby will retain a small amount of milk in his or her mouth.  Your baby will let go of your breast by himself or herself. Signs from you  Breasts that have increased in firmness, weight, and size 1-3 hours after feeding.  Breasts that are softer immediately after breastfeeding.  Increased milk volume, as well as a change in milk consistency and color by the fifth  day of breastfeeding.  Nipples that are not sore, cracked, or bleeding. Signs that your baby is getting enough milk  Wetting at least 1-2 diapers during the first 24 hours after birth.  Wetting at least 5-6 diapers every 24 hours for the first week after birth. The urine should be clear or pale yellow by the age of 5 days.  Wetting 6-8  diapers every 24 hours as your baby continues to grow and develop.  At least 3 stools in a 24-hour period by the age of 5 days. The stool should be soft and yellow.  At least 3 stools in a 24-hour period by the age of 7 days. The stool should be seedy and yellow.  No loss of weight greater than 10% of birth weight during the first 3 days of life.  Average weight gain of 4-7 oz (113-198 g) per week after the age of 4 days.  Consistent daily weight gain by the age of 5 days, without weight loss after the age of 2 weeks. After a feeding, your baby may spit up a small amount of milk. This is normal. Breastfeeding frequency and duration Frequent feeding will help you make more milk and can prevent sore nipples and extremely full breasts (breast engorgement). Breastfeed when you feel the need to reduce the fullness of your breasts or when your baby shows signs of hunger. This is called "breastfeeding on demand." Signs that your baby is hungry include:  Increased alertness, activity, or restlessness.  Movement of the head from side to side.  Opening of the mouth when the corner of the mouth or cheek is stroked (rooting).  Increased sucking sounds, smacking lips, cooing, sighing, or squeaking.  Hand-to-mouth movements and sucking on fingers or hands.  Fussing or crying. Avoid introducing a pacifier to your baby in the first 4-6 weeks after your baby is born. After this time, you may choose to use a pacifier. Research has shown that pacifier use during the first year of a baby's life decreases the risk of sudden infant death syndrome (SIDS). Allow your baby to feed on each breast as long as he or she wants. When your baby unlatches or falls asleep while feeding from the first breast, offer the second breast. Because newborns are often sleepy in the first few weeks of life, you may need to awaken your baby to get him or her to feed. Breastfeeding times will vary from baby to baby. However, the  following rules can serve as a guide to help you make sure that your baby is properly fed:  Newborns (babies 20 weeks of age or younger) may breastfeed every 1-3 hours.  Newborns should not go without breastfeeding for longer than 3 hours during the day or 5 hours during the night.  You should breastfeed your baby a minimum of 8 times in a 24-hour period. Breast milk pumping Pumping and storing breast milk allows you to make sure that your baby is exclusively fed your breast milk, even at times when you are unable to breastfeed. This is especially important if you go back to work while you are still breastfeeding, or if you are not able to be present during feedings. Your lactation consultant can help you find a method of pumping that works best for you and give you guidelines about how long it is safe to store breast milk.      Caring for your breasts while you breastfeed Nipples can become dry, cracked, and sore while breastfeeding. The  following recommendations can help keep your breasts moisturized and healthy:  Avoid using soap on your nipples.  Wear a supportive bra designed especially for nursing. Avoid wearing underwire-style bras or extremely tight bras (sports bras).  Air-dry your nipples for 3-4 minutes after each feeding.  Use only cotton bra pads to absorb leaked breast milk. Leaking of breast milk between feedings is normal.  Use lanolin on your nipples after breastfeeding. Lanolin helps to maintain your skin's normal moisture barrier. Pure lanolin is not harmful (not toxic) to your baby. You may also hand express a few drops of breast milk and gently massage that milk into your nipples and allow the milk to air-dry. In the first few weeks after giving birth, some women experience breast engorgement. Engorgement can make your breasts feel heavy, warm, and tender to the touch. Engorgement peaks within 3-5 days after you give birth. The following recommendations can help to ease  engorgement:  Completely empty your breasts while breastfeeding or pumping. You may want to start by applying warm, moist heat (in the shower or with warm, water-soaked hand towels) just before feeding or pumping. This increases circulation and helps the milk flow. If your baby does not completely empty your breasts while breastfeeding, pump any extra milk after he or she is finished.  Apply ice packs to your breasts immediately after breastfeeding or pumping, unless this is too uncomfortable for you. To do this: ? Put ice in a plastic bag. ? Place a towel between your skin and the bag. ? Leave the ice on for 20 minutes, 2-3 times a day.  Make sure that your baby is latched on and positioned properly while breastfeeding. If engorgement persists after 48 hours of following these recommendations, contact your health care provider or a Science writer. Overall health care recommendations while breastfeeding  Eat 3 healthy meals and 3 snacks every day. Well-nourished mothers who are breastfeeding need an additional 450-500 calories a day. You can meet this requirement by increasing the amount of a balanced diet that you eat.  Drink enough water to keep your urine pale yellow or clear.  Rest often, relax, and continue to take your prenatal vitamins to prevent fatigue, stress, and low vitamin and mineral levels in your body (nutrient deficiencies).  Do not use any products that contain nicotine or tobacco, such as cigarettes and e-cigarettes. Your baby may be harmed by chemicals from cigarettes that pass into breast milk and exposure to secondhand smoke. If you need help quitting, ask your health care provider.  Avoid alcohol.  Do not use illegal drugs or marijuana.  Talk with your health care provider before taking any medicines. These include over-the-counter and prescription medicines as well as vitamins and herbal supplements. Some medicines that may be harmful to your baby can pass  through breast milk.  It is possible to become pregnant while breastfeeding. If birth control is desired, ask your health care provider about options that will be safe while breastfeeding your baby. Where to find more information: [employer]: www.llli.org Contact a health care provider if:  You feel like you want to stop breastfeeding or have become frustrated with breastfeeding.  Your nipples are cracked or bleeding.  Your breasts are red, tender, or warm.  You have: ? Painful breasts or nipples. ? A swollen area on either breast. ? A fever or chills. ? Nausea or vomiting. ? Drainage other than breast milk from your nipples.  Your breasts do not become full before  feedings by the fifth day after you give birth.  You feel sad and depressed.  Your baby is: ? Too sleepy to eat well. ? Having trouble sleeping. ? More than 24 week old and wetting fewer than 6 diapers in a 24-hour period. ? Not gaining weight by 71 days of age.  Your baby has fewer than 3 stools in a 24-hour period.  Your baby's skin or the white parts of his or her eyes become yellow. Get help right away if:  Your baby is overly tired (lethargic) and does not want to wake up and feed.  Your baby develops an unexplained fever. Summary  Breastfeeding offers many health benefits for infant and mothers.  Try to breastfeed your infant when he or she shows early signs of hunger.  Gently tickle or stroke your baby's lips with your finger or nipple to allow the baby to open his or her mouth. Bring the baby to your breast. Make sure that much of the areola is in your baby's mouth. Offer one side and burp the baby before you offer the other side.  Talk with your health care provider or lactation consultant if you have questions or you face problems as you breastfeed. This information is not intended to replace advice given to you by your health care provider. Make sure you discuss any questions you  have with your health care provider. Document Revised: 06/03/2017 Document Reviewed: 04/10/2016 Elsevier Patient Education  2021 [employer].

## 2020-05-29 NOTE — Progress Notes (Signed)
ROB- GTT, btc, tdap. TOC- for Chlamydia.

## 2020-05-30 ENCOUNTER — Ambulatory Visit (INDEPENDENT_AMBULATORY_CARE_PROVIDER_SITE_OTHER): Payer: Managed Care, Other (non HMO) | Admitting: Primary Care

## 2020-05-30 ENCOUNTER — Encounter: Payer: Self-pay | Admitting: Primary Care

## 2020-05-30 DIAGNOSIS — J45909 Unspecified asthma, uncomplicated: Secondary | ICD-10-CM

## 2020-05-30 DIAGNOSIS — O99513 Diseases of the respiratory system complicating pregnancy, third trimester: Secondary | ICD-10-CM

## 2020-05-30 LAB — CBC
Hematocrit: 34.3 % (ref 34.0–46.6)
Hemoglobin: 11.7 g/dL (ref 11.1–15.9)
MCH: 31 pg (ref 26.6–33.0)
MCHC: 34.1 g/dL (ref 31.5–35.7)
MCV: 91 fL (ref 79–97)
Platelets: 220 10*3/uL (ref 150–450)
RBC: 3.78 x10E6/uL (ref 3.77–5.28)
RDW: 12.2 % (ref 11.7–15.4)
WBC: 12.8 10*3/uL — ABNORMAL HIGH (ref 3.4–10.8)

## 2020-05-30 LAB — GLUCOSE, 1 HOUR GESTATIONAL: Gestational Diabetes Screen: 84 mg/dL (ref 65–139)

## 2020-05-30 LAB — RPR: RPR Ser Ql: NONREACTIVE

## 2020-05-30 LAB — GC/CHLAMYDIA PROBE AMP
Chlamydia trachomatis, NAA: NEGATIVE
Neisseria Gonorrhoeae by PCR: NEGATIVE
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
## 2020-05-30 NOTE — Assessment & Plan Note (Addendum)
-   Patient is in her third trimester of pregnancy. Her asthma symptoms have been well controlled on Symbicort 80. She uses maintenance inhaler three times a week on average d/t cost of inhaler. She brought in her patient assistance paperwork today which we will submit. Recommend she use Symbicort 80 1 puff twice daily everyday. FU in 6 weeks/televisit to continue to monitor her status.

## 2020-05-30 NOTE — Patient Instructions (Signed)
Nice seeing you today Carly Williams  Recommendations: - Take Symbicort 1-2 puffs morning and evening (every day) - We will send in patient assistance paper work, they should notify both our office and you about decision. This typically takes 1-2 weeks. If you are running out of Symbicort and can not afford please notify our office a couple days before your last dose  Follow-up - 6 weeks with Dr. Jayme Cloud or APP / televisit ok

## 2020-06-19 ENCOUNTER — Encounter: Payer: Self-pay | Admitting: Obstetrics and Gynecology

## 2020-06-19 ENCOUNTER — Ambulatory Visit (INDEPENDENT_AMBULATORY_CARE_PROVIDER_SITE_OTHER): Payer: Managed Care, Other (non HMO) | Admitting: Obstetrics and Gynecology

## 2020-06-19 ENCOUNTER — Other Ambulatory Visit: Payer: Self-pay

## 2020-06-19 VITALS — BP 111/74 | HR 88 | Ht 61.0 in | Wt 189.6 lb

## 2020-06-19 DIAGNOSIS — M25559 Pain in unspecified hip: Secondary | ICD-10-CM

## 2020-06-19 DIAGNOSIS — O26893 Other specified pregnancy related conditions, third trimester: Secondary | ICD-10-CM

## 2020-06-19 DIAGNOSIS — Z3A30 30 weeks gestation of pregnancy: Secondary | ICD-10-CM

## 2020-06-19 DIAGNOSIS — Z3403 Encounter for supervision of normal first pregnancy, third trimester: Secondary | ICD-10-CM

## 2020-06-19 LAB — POCT URINALYSIS DIPSTICK OB
Bilirubin, UA: NEGATIVE
Blood, UA: NEGATIVE
Glucose, UA: NEGATIVE
Ketones, UA: NEGATIVE
Leukocytes, UA: NEGATIVE
Nitrite, UA: NEGATIVE
Spec Grav, UA: 1.025 (ref 1.010–1.025)
Urobilinogen, UA: 0.2 E.U./dL
pH, UA: 6 (ref 5.0–8.0)
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
## 2020-06-19 NOTE — Progress Notes (Signed)
ROB: Patient reports that she has been experiencing hip and pelvic pain.  Notes remote h/o hip injury.  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 May require additional paperwork to have restrictions. Advised that if pain increases, can also refer to physical therapy if she would be able to attend the sessions.

## 2020-06-19 NOTE — Patient Instructions (Signed)
WHAT OB PATIENTS CAN EXPECT   Confirmation of pregnancy and ultrasound ordered if medically indicated-[redacted] weeks gestation  New OB (NOB) intake with nurse and New OB (NOB) labs- [redacted] weeks gestation  New OB (NOB) physical examination with provider- 11/[redacted] weeks gestation  Flu vaccine-[redacted] weeks gestation  Anatomy scan-[redacted] weeks gestation  Glucose tolerance test, blood work to test for anemia, T-dap vaccine-[redacted] weeks gestation  Vaginal swabs/cultures-STD/Group B strep-[redacted] weeks gestation  Appointments every 4 weeks until 28 weeks  Every 2 weeks from 28 weeks until 36 weeks  Weekly visits from 82 weeks until delivery  Third Trimester of Pregnancy  The third trimester of pregnancy is from week 28 through week 55. This is also called months 7 through 9. This trimester is when your unborn baby (fetus) is growing very fast. At the end of the ninth month, the unborn baby is about 20 inches long. It weighs about 6-10 pounds. Body changes during your third trimester Your body continues to go through many changes during this time. The changes vary and generally return to normal after the baby is born. Physical changes  Your weight will continue to increase. You may gain 25-35 pounds (11-16 kg) by the end of the pregnancy. If you are underweight, you may gain 28-40 lb (about 13-18 kg). If you are overweight, you may gain 15-25 lb (about 7-11 kg).  You may start to get stretch marks on your hips, belly (abdomen), and breasts.  Your breasts will continue to grow and may hurt. A yellow fluid (colostrum) may leak from your breasts. This is the first milk you are making for your baby.  You may have changes in your hair.  Your belly button may stick out.  You may have more swelling in your hands, face, or ankles. Health changes  You may have heartburn.  You may have trouble pooping (constipation).  You may get hemorrhoids. These are swollen veins in the butt that can itch or get painful.  You may  have swollen veins (varicose veins) in your legs.  You may have more body aches in the pelvis, back, or thighs.  You may have more tingling or numbness in your hands, arms, and legs. The skin on your belly may also feel numb.  You may feel short of breath as your womb (uterus) gets bigger. Other changes  You may pee (urinate) more often.  You may have more problems sleeping.  You may notice the unborn baby "dropping," or moving lower in your belly.  You may have more discharge coming from your vagina.  Your joints may feel loose, and you may have pain around your pelvic bone. Follow these instructions at home: Medicines  Take over-the-counter and prescription medicines only as told by your doctor. Some medicines are not safe during pregnancy.  Take a prenatal vitamin that contains at least 600 micrograms (mcg) of folic acid. Eating and drinking  Eat healthy meals that include: ? Fresh fruits and vegetables. ? Whole grains. ? Good sources of protein, such as meat, eggs, or tofu. ? Low-fat dairy products.  Avoid raw meat and unpasteurized juice, milk, and cheese. These carry germs that can harm you and your baby.  Eat 4 or 5 small meals rather than 3 large meals a day.  You may need to take these actions to prevent or treat trouble pooping: ? Drink enough fluids to keep your pee (urine) pale yellow. ? Eat foods that are high in fiber. These include beans, whole grains, and  fresh fruits and vegetables. ? Limit foods that are high in fat and sugar. These include fried or sweet foods. Activity  Exercise only as told by your doctor. Stop exercising if you start to have cramps in your womb.  Avoid heavy lifting.  Do not exercise if it is too hot or too humid, or if you are in a place of great height (high altitude).  If you choose to, you may have sex unless your doctor tells you not to. Relieving pain and discomfort  Take breaks often, and rest with your legs raised  (elevated) if you have leg cramps or low back pain.  Take warm water baths (sitz baths) to soothe pain or discomfort caused by hemorrhoids. Use hemorrhoid cream if your doctor approves.  Wear a good support bra if your breasts are tender.  If you develop bulging, swollen veins in your legs: ? Wear support hose as told by your doctor. ? Raise your feet for 15 minutes, 3-4 times a day. ? Limit salt in your food. Safety  Talk to your doctor before traveling far distances.  Do not use hot tubs, steam rooms, or saunas.  Wear your seat belt at all times when you are in a car.  Talk with your doctor if someone is hurting you or yelling at you a lot. Preparing for your baby's arrival To prepare for the arrival of your baby:  Take prenatal classes.  Visit the hospital and tour the maternity area.  Buy a rear-facing car seat. Learn how to install it in your car.  Prepare the baby's room. Take out all pillows and stuffed animals from the baby's crib. General instructions  Avoid cat litter boxes and soil used by cats. These carry germs that can cause harm to the baby and can cause a loss of your baby by miscarriage or stillbirth.  Do not douche or use tampons. Do not use scented sanitary pads.  Do not smoke or use any products that contain nicotine or tobacco. If you need help quitting, ask your doctor.  Do not drink alcohol.  Do not use herbal medicines, illegal drugs, or medicines that were not approved by your doctor. Chemicals in these products can affect your baby.  Keep all follow-up visits. This is important. Where to find more information  American Pregnancy Association: americanpregnancy.org  [employer]: www.acog.org  Office on Women's Health: KeywordPortfolios.com.br Contact a doctor if:  You have a fever.  You have mild cramps or pressure in your lower belly.  You have a nagging pain in your belly area.  You vomit, or  you have watery poop (diarrhea).  You have bad-smelling fluid coming from your vagina.  You have pain when you pee, or your pee smells bad.  You have a headache that does not go away when you take medicine.  You have changes in how you see, or you see spots in front of your eyes. Get help right away if:  Your water breaks.  You have regular contractions that are less than 5 minutes apart.  You are spotting or bleeding from your vagina.  You have very bad belly cramps or pain.  You have trouble breathing.  You have chest pain.  You faint.  You have not felt the baby move for the amount of time told by your doctor.  You have new or increased pain, swelling, or redness in an arm or leg. Summary  The third trimester is from week 28 through  week 40 (months 7 through 9). This is the time when your unborn baby is growing very fast.  During this time, your discomfort may increase as you gain weight and as your baby grows.  Get ready for your baby to arrive by taking prenatal classes, buying a rear-facing car seat, and preparing the baby's room.  Get help right away if you are bleeding from your vagina, you have chest pain and trouble breathing, or you have not felt the baby move for the amount of time told by your doctor. This information is not intended to replace advice given to you by your health care provider. Make sure you discuss any questions you have with your health care provider. Document Revised: 08/16/2019 Document Reviewed: 06/22/2019 Elsevier Patient Education  [place]. Common Medications Safe in Pregnancy  Acne:      Constipation:  Benzoyl Peroxide     Colace  Clindamycin      Dulcolax Suppository  Topica Erythromycin     Fibercon  Salicylic Acid      Metamucil         Miralax AVOID:        Senakot   Accutane    Cough:  Retin-A       Cough Drops  Tetracycline      Phenergan w/ Codeine if Rx  Minocycline      Robitussin (Plain &  DM)  Antibiotics:     Crabs/Lice:  Ceclor       RID  Cephalosporins    AVOID:  E-Mycins      Kwell  Keflex  Macrobid/Macrodantin   Diarrhea:  Penicillin      Kao-Pectate  Zithromax      Imodium AD         PUSH FLUIDS AVOID:       Cipro     Fever:  Tetracycline      Tylenol (Regular or Extra  Minocycline       Strength)  Levaquin      Extra Strength-Do not          Exceed 8 tabs/24 hrs Caffeine:        <276m/day (equiv. To 1 cup of coffee or  approx. 3 12 oz sodas)         Gas: Cold/Hayfever:       Gas-X  Benadryl      Mylicon  Claritin       Phazyme  **Claritin-D        Chlor-Trimeton    Headaches:  Dimetapp      ASA-Free Excedrin  Drixoral-Non-Drowsy     Cold Compress  Mucinex (Guaifenasin)     Tylenol (Regular or Extra  Sudafed/Sudafed-12 Hour     Strength)  **Sudafed PE Pseudoephedrine   Tylenol Cold & Sinus     Vicks Vapor Rub  Zyrtec  **AVOID if Problems With Blood Pressure         Heartburn: Avoid lying down for at least 1 hour after meals  Aciphex      Maalox     Rash:  Milk of Magnesia     Benadryl    Mylanta       1% Hydrocortisone Cream  Pepcid  Pepcid Complete   Sleep Aids:  Prevacid      Ambien   Prilosec       Benadryl  Rolaids       Chamomile Tea  Tums (Limit 4/day)     Unisom  Tylenol PM         Warm milk-add vanilla or  Hemorrhoids:       Sugar for taste  Anusol/Anusol H.C.  (RX: Analapram 2.5%)  Sugar Substitutes:  Hydrocortisone OTC     Ok in moderation  Preparation H      Tucks        Vaseline lotion applied to tissue with wiping    Herpes:     Throat:  Acyclovir      Oragel  Famvir  Valtrex     Vaccines:         Flu Shot Leg Cramps:       *Gardasil  Benadryl      Hepatitis A         Hepatitis B Nasal Spray:       Pneumovax  Saline Nasal Spray     Polio Booster         Tetanus Nausea:       Tuberculosis test or PPD  Vitamin B6 25 mg TID   AVOID:    Dramamine      *Gardasil  Emetrol       Live Poliovirus  Ginger  Root 250 mg QID    MMR (measles, mumps &  High Complex Carbs @ Bedtime    rebella)  Sea Bands-Accupressure    Varicella (Chickenpox)  Unisom 1/2 tab TID     *No known complications           If received before Pain:         Known pregnancy;   Darvocet       Resume series after  Lortab        Delivery  Percocet    Yeast:   Tramadol      Femstat  Tylenol 3      Gyne-lotrimin  Ultram       Monistat  Vicodin           MISC:         All Sunscreens           Hair Coloring/highlights          Insect Repellant's          (Including DEET)         Mystic Tans  

## 2020-06-20 NOTE — Progress Notes (Signed)
Agree with the details of the visit as noted by Elizabeth Walsh, NP.  C. Laura Iylah Dworkin, MD [facility] 

## 2020-07-02 ENCOUNTER — Other Ambulatory Visit: Payer: Self-pay

## 2020-07-02 ENCOUNTER — Encounter: Payer: Self-pay | Admitting: Obstetrics and Gynecology

## 2020-07-02 ENCOUNTER — Ambulatory Visit (INDEPENDENT_AMBULATORY_CARE_PROVIDER_SITE_OTHER): Payer: Managed Care, Other (non HMO) | Admitting: Obstetrics and Gynecology

## 2020-07-02 VITALS — BP 103/72 | HR 92 | Wt 196.4 lb

## 2020-07-02 DIAGNOSIS — Z3403 Encounter for supervision of normal first pregnancy, third trimester: Secondary | ICD-10-CM

## 2020-07-02 DIAGNOSIS — Z3A32 32 weeks gestation of pregnancy: Secondary | ICD-10-CM

## 2020-07-02 DIAGNOSIS — Z0289 Encounter for other administrative examinations: Secondary | ICD-10-CM

## 2020-07-02 LAB — POCT URINALYSIS DIPSTICK OB
Bilirubin, UA: NEGATIVE
Blood, UA: NEGATIVE
Glucose, UA: NEGATIVE
Ketones, UA: NEGATIVE
Leukocytes, UA: NEGATIVE
Nitrite, UA: NEGATIVE
POC,PROTEIN,UA: NEGATIVE
Spec Grav, UA: 1.01 (ref 1.010–1.025)
Urobilinogen, UA: 0.2 E.U./dL
pH, UA: 6 (ref 5.0–8.0)

## 2020-07-02 NOTE — Patient Instructions (Signed)
Third Trimester of Pregnancy  The third trimester of pregnancy is from week 28 through week 40. This is also called months 7 through 9. This trimester is when your unborn baby (fetus) is growing very fast. At the end of the ninth month, the unborn baby is about 20 inches long. It weighs about 6-10 pounds. Body changes during your third trimester Your body continues to go through many changes during this time. The changes vary and generally return to normal after the baby is born. Physical changes  Your weight will continue to increase. You may gain 25-35 pounds (11-16 kg) by the end of the pregnancy. If you are underweight, you may gain 28-40 lb (about 13-18 kg). If you are overweight, you may gain 15-25 lb (about 7-11 kg).  You may start to get stretch marks on your hips, belly (abdomen), and breasts.  Your breasts will continue to grow and may hurt. A yellow fluid (colostrum) may leak from your breasts. This is the first milk you are making for your baby.  You may have changes in your hair.  Your belly button may stick out.  You may have more swelling in your hands, face, or ankles. Health changes  You may have heartburn.  You may have trouble pooping (constipation).  You may get hemorrhoids. These are swollen veins in the butt that can itch or get painful.  You may have swollen veins (varicose veins) in your legs.  You may have more body aches in the pelvis, back, or thighs.  You may have more tingling or numbness in your hands, arms, and legs. The skin on your belly may also feel numb.  You may feel short of breath as your womb (uterus) gets bigger. Other changes  You may pee (urinate) more often.  You may have more problems sleeping.  You may notice the unborn baby "dropping," or moving lower in your belly.  You may have more discharge coming from your vagina.  Your joints may feel loose, and you may have pain around your pelvic bone. Follow these instructions at  home: Medicines  Take over-the-counter and prescription medicines only as told by your doctor. Some medicines are not safe during pregnancy.  Take a prenatal vitamin that contains at least 600 micrograms (mcg) of folic acid. Eating and drinking  Eat healthy meals that include: ? Fresh fruits and vegetables. ? Whole grains. ? Good sources of protein, such as meat, eggs, or tofu. ? Low-fat dairy products.  Avoid raw meat and unpasteurized juice, milk, and cheese. These carry germs that can harm you and your baby.  Eat 4 or 5 small meals rather than 3 large meals a day.  You may need to take these actions to prevent or treat trouble pooping: ? Drink enough fluids to keep your pee (urine) pale yellow. ? Eat foods that are high in fiber. These include beans, whole grains, and fresh fruits and vegetables. ? Limit foods that are high in fat and sugar. These include fried or sweet foods. Activity  Exercise only as told by your doctor. Stop exercising if you start to have cramps in your womb.  Avoid heavy lifting.  Do not exercise if it is too hot or too humid, or if you are in a place of great height (high altitude).  If you choose to, you may have sex unless your doctor tells you not to. Relieving pain and discomfort  Take breaks often, and rest with your legs raised (elevated) if you have   leg cramps or low back pain.  Take warm water baths (sitz baths) to soothe pain or discomfort caused by hemorrhoids. Use hemorrhoid cream if your doctor approves.  Wear a good support bra if your breasts are tender.  If you develop bulging, swollen veins in your legs: ? Wear support hose as told by your doctor. ? Raise your feet for 15 minutes, 3-4 times a day. ? Limit salt in your food. Safety  Talk to your doctor before traveling far distances.  Do not use hot tubs, steam rooms, or saunas.  Wear your seat belt at all times when you are in a car.  Talk with your doctor if someone is  hurting you or yelling at you a lot. Preparing for your baby's arrival To prepare for the arrival of your baby:  Take prenatal classes.  Visit the hospital and tour the maternity area.  Buy a rear-facing car seat. Learn how to install it in your car.  Prepare the baby's room. Take out all pillows and stuffed animals from the baby's crib. General instructions  Avoid cat litter boxes and soil used by cats. These carry germs that can cause harm to the baby and can cause a loss of your baby by miscarriage or stillbirth.  Do not douche or use tampons. Do not use scented sanitary pads.  Do not smoke or use any products that contain nicotine or tobacco. If you need help quitting, ask your doctor.  Do not drink alcohol.  Do not use herbal medicines, illegal drugs, or medicines that were not approved by your doctor. Chemicals in these products can affect your baby.  Keep all follow-up visits. This is important. Where to find more information  American Pregnancy Association: americanpregnancy.org  American College of Obstetricians and Gynecologists: www.acog.org  Office on Women's Health: womenshealth.gov/pregnancy Contact a doctor if:  You have a fever.  You have mild cramps or pressure in your lower belly.  You have a nagging pain in your belly area.  You vomit, or you have watery poop (diarrhea).  You have bad-smelling fluid coming from your vagina.  You have pain when you pee, or your pee smells bad.  You have a headache that does not go away when you take medicine.  You have changes in how you see, or you see spots in front of your eyes. Get help right away if:  Your water breaks.  You have regular contractions that are less than 5 minutes apart.  You are spotting or bleeding from your vagina.  You have very bad belly cramps or pain.  You have trouble breathing.  You have chest pain.  You faint.  You have not felt the baby move for the amount of time told by  your doctor.  You have new or increased pain, swelling, or redness in an arm or leg. Summary  The third trimester is from week 28 through week 40 (months 7 through 9). This is the time when your unborn baby is growing very fast.  During this time, your discomfort may increase as you gain weight and as your baby grows.  Get ready for your baby to arrive by taking prenatal classes, buying a rear-facing car seat, and preparing the baby's room.  Get help right away if you are bleeding from your vagina, you have chest pain and trouble breathing, or you have not felt the baby move for the amount of time told by your doctor. This information is not intended to replace advice given   to you by your health care provider. Make sure you discuss any questions you have with your health care provider. Document Revised: 08/16/2019 Document Reviewed: 06/22/2019 Elsevier Patient Education  2021 [employer] Inc.    [employer]osen's Emergency Medicine: Concepts and Clinical Practice (9th ed., pp. 2296- 2312). Elsevier.">  Braxton Hicks Contractions Contractions of the uterus can occur throughout pregnancy, but they are not always a sign that you are in labor. You may have practice contractions called Braxton Hicks contractions. These false labor contractions are sometimes confused with true labor. What are Braxton Hicks contractions? Braxton Hicks contractions are tightening movements that occur in the muscles of the uterus before labor. Unlike true labor contractions, these contractions do not result in opening (dilation) and thinning of the cervix. Toward the end of pregnancy (32-34 weeks), Braxton Hicks contractions can happen more often and may become stronger. These contractions are sometimes difficult to tell apart from true labor because they can be very uncomfortable. You should not feel embarrassed if you go to the hospital with false labor. Sometimes, the only way to tell if you are in true labor is for your health  care provider to look for changes in the cervix. The health care provider will do a physical exam and may monitor your contractions. If you are not in true labor, the exam should show that your cervix is not dilating and your water has not broken. If there are no other health problems associated with your pregnancy, it is completely safe for you to be sent home with false labor. You may continue to have Braxton Hicks contractions until you go into true labor. How to tell the difference between true labor and false labor True labor  Contractions last 30-70 seconds.  Contractions become very regular.  Discomfort is usually felt in the top of the uterus, and it spreads to the lower abdomen and low back.  Contractions do not go away with walking.  Contractions usually become more intense and increase in frequency.  The cervix dilates and gets thinner. False labor  Contractions are usually shorter and not as strong as true labor contractions.  Contractions are usually irregular.  Contractions are often felt in the front of the lower abdomen and in the groin.  Contractions may go away when you walk around or change positions while lying down.  Contractions get weaker and are shorter-lasting as time goes on.  The cervix usually does not dilate or become thin. Follow these instructions at home:  Take over-the-counter and prescription medicines only as told by your health care provider.  Keep up with your usual exercises and follow other instructions from your health care provider.  Eat and drink lightly if you think you are going into labor.  If Braxton Hicks contractions are making you uncomfortable: ? Change your position from lying down or resting to walking, or change from walking to resting. ? Sit and rest in a tub of warm water. ? Drink enough fluid to keep your urine pale yellow. Dehydration may cause these contractions. ? Do slow and deep breathing several times an  hour.  Keep all follow-up prenatal visits as told by your health care provider. This is important.   Contact a health care provider if:  You have a fever.  You have continuous pain in your abdomen. Get help right away if:  Your contractions become stronger, more regular, and closer together.  You have fluid leaking or gushing from your vagina.  You pass blood-tinged mucus (bloody show).    You have bleeding from your vagina.  You have low back pain that you never had before.  You feel your baby's head pushing down and causing pelvic pressure.  Your baby is not moving inside you as much as it used to. Summary  Contractions that occur before labor are called Braxton Hicks contractions, false labor, or practice contractions.  Braxton Hicks contractions are usually shorter, weaker, farther apart, and less regular than true labor contractions. True labor contractions usually become progressively stronger and regular, and they become more frequent.  Manage discomfort from Braxton Hicks contractions by changing position, resting in a warm bath, drinking plenty of water, or practicing deep breathing. This information is not intended to replace advice given to you by your health care provider. Make sure you discuss any questions you have with your health care provider. Document Revised: 02/19/2017 Document Reviewed: 07/23/2016 Elsevier Patient Education  2021 Elsevier Inc.  

## 2020-07-02 NOTE — Progress Notes (Signed)
ROB: Patient doing generally better.  Occasional Braxton Hicks contractions.  Mild carpal tunnel-wrist splints discussed while sleeping.

## 2020-07-10 ENCOUNTER — Telehealth: Payer: Self-pay | Admitting: Obstetrics and Gynecology

## 2020-07-10 NOTE — Telephone Encounter (Signed)
Pt called stated that she is having discomfort in vaginal area- like pressure pt is concerned and wants to know if she needs to be evaluated or what she needs to do. Pt can not feel when sitting down- pt is 34 weeks.Please Advice.

## 2020-07-10 NOTE — Telephone Encounter (Signed)
LM for patient to return call.

## 2020-07-16 ENCOUNTER — Encounter: Payer: Self-pay | Admitting: Adult Health

## 2020-07-16 ENCOUNTER — Encounter: Payer: Self-pay | Admitting: Pulmonary Disease

## 2020-07-16 ENCOUNTER — Other Ambulatory Visit: Payer: Self-pay

## 2020-07-16 ENCOUNTER — Ambulatory Visit (INDEPENDENT_AMBULATORY_CARE_PROVIDER_SITE_OTHER): Payer: Managed Care, Other (non HMO) | Admitting: Adult Health

## 2020-07-16 DIAGNOSIS — J4530 Mild persistent asthma, uncomplicated: Secondary | ICD-10-CM | POA: Diagnosis not present
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
## 2020-07-16 NOTE — Progress Notes (Signed)
Agree with the details of the visit as noted by Tammy Parrett, NP.  C. Laura Camdynn Maranto, MD [facility] 

## 2020-07-16 NOTE — Patient Instructions (Signed)
Continue on Symbicort 81 puff twice daily, rinse after use Albuterol 1 to 2 puffs every 6 hours as needed for wheezing  Follow-up in 3 months with Dr. Jayme Cloud and as needed

## 2020-07-17 ENCOUNTER — Ambulatory Visit (INDEPENDENT_AMBULATORY_CARE_PROVIDER_SITE_OTHER): Payer: Managed Care, Other (non HMO) | Admitting: Obstetrics and Gynecology

## 2020-07-17 ENCOUNTER — Other Ambulatory Visit: Payer: Self-pay

## 2020-07-17 ENCOUNTER — Encounter: Payer: Self-pay | Admitting: Obstetrics and Gynecology

## 2020-07-17 VITALS — BP 113/74 | HR 90 | Ht 61.0 in | Wt 198.3 lb

## 2020-07-17 DIAGNOSIS — R102 Pelvic and perineal pain: Secondary | ICD-10-CM

## 2020-07-17 DIAGNOSIS — Z3403 Encounter for supervision of normal first pregnancy, third trimester: Secondary | ICD-10-CM

## 2020-07-17 DIAGNOSIS — O26893 Other specified pregnancy related conditions, third trimester: Secondary | ICD-10-CM

## 2020-07-17 DIAGNOSIS — Z3A34 34 weeks gestation of pregnancy: Secondary | ICD-10-CM

## 2020-07-17 LAB — POCT URINALYSIS DIPSTICK OB
Bilirubin, UA: NEGATIVE
Blood, UA: NEGATIVE
Glucose, UA: NEGATIVE
Ketones, UA: NEGATIVE
Leukocytes, UA: NEGATIVE
Nitrite, UA: NEGATIVE
POC,PROTEIN,UA: NEGATIVE
Spec Grav, UA: 1.01 (ref 1.010–1.025)
Urobilinogen, UA: 0.2 E.U./dL
pH, UA: 7.5 (ref 5.0–8.0)

## 2020-07-17 NOTE — Progress Notes (Signed)
OB-Pt present for routine prenatal care. Pt stated having pelvic pressure.

## 2020-07-17 NOTE — Patient Instructions (Signed)
Common Medications Safe in Pregnancy  Acne:      Constipation:  Benzoyl Peroxide     Colace  Clindamycin      Dulcolax Suppository  Topica Erythromycin     Fibercon  Salicylic Acid      Metamucil         Miralax AVOID:        Senakot   Accutane    Cough:  Retin-A       Cough Drops  Tetracycline      Phenergan w/ Codeine if Rx  Minocycline      Robitussin (Plain & DM)  Antibiotics:     Crabs/Lice:  Ceclor       RID  Cephalosporins    AVOID:  E-Mycins      Kwell  Keflex  Macrobid/Macrodantin   Diarrhea:  Penicillin      Kao-Pectate  Zithromax      Imodium AD         PUSH FLUIDS AVOID:       Cipro     Fever:  Tetracycline      Tylenol (Regular or Extra  Minocycline       Strength)  Levaquin      Extra Strength-Do not          Exceed 8 tabs/24 hrs Caffeine:        <200mg/day (equiv. To 1 cup of coffee or  approx. 3 12 oz sodas)         Gas: Cold/Hayfever:       Gas-X  Benadryl      Mylicon  Claritin       Phazyme  **Claritin-D        Chlor-Trimeton    Headaches:  Dimetapp      ASA-Free Excedrin  Drixoral-Non-Drowsy     Cold Compress  Mucinex (Guaifenasin)     Tylenol (Regular or Extra  Sudafed/Sudafed-12 Hour     Strength)  **Sudafed PE Pseudoephedrine   Tylenol Cold & Sinus     Vicks Vapor Rub  Zyrtec  **AVOID if Problems With Blood Pressure         Heartburn: Avoid lying down for at least 1 hour after meals  Aciphex      Maalox     Rash:  Milk of Magnesia     Benadryl    Mylanta       1% Hydrocortisone Cream  Pepcid  Pepcid Complete   Sleep Aids:  Prevacid      Ambien   Prilosec       Benadryl  Rolaids       Chamomile Tea  Tums (Limit 4/day)     Unisom         Tylenol PM         Warm milk-add vanilla or  Hemorrhoids:       Sugar for taste  Anusol/Anusol H.C.  (RX: Analapram 2.5%)  Sugar Substitutes:  Hydrocortisone OTC     Ok in moderation  Preparation H      Tucks        Vaseline lotion applied to tissue with  wiping    Herpes:     Throat:  Acyclovir      Oragel  Famvir  Valtrex     Vaccines:         Flu Shot Leg Cramps:       *Gardasil  Benadryl      Hepatitis A         Hepatitis B Nasal Spray:         Pneumovax  Saline Nasal Spray     Polio Booster         Tetanus Nausea:       Tuberculosis test or PPD  Vitamin B6 25 mg TID   AVOID:    Dramamine      *Gardasil  Emetrol       Live Poliovirus  Ginger Root 250 mg QID    MMR (measles, mumps &  High Complex Carbs @ Bedtime    rebella)  Sea Bands-Accupressure    Varicella (Chickenpox)  Unisom 1/2 tab TID     *No known complications           If received before Pain:         Known pregnancy;   Darvocet       Resume series after  Lortab        Delivery  Percocet    Yeast:   Tramadol      Femstat  Tylenol 3      Gyne-lotrimin  Ultram       Monistat  Vicodin           MISC:         All Sunscreens           Hair Coloring/highlights          Insect Repellant's          (Including DEET)         Mystic Tans WHAT OB PATIENTS CAN EXPECT   Confirmation of pregnancy and ultrasound ordered if medically indicated-[redacted] weeks gestation  New OB (NOB) intake with nurse and New OB (NOB) labs- [redacted] weeks gestation  New OB (NOB) physical examination with provider- 11/[redacted] weeks gestation  Flu vaccine-[redacted] weeks gestation  Anatomy scan-[redacted] weeks gestation  Glucose tolerance test, blood work to test for anemia, T-dap vaccine-[redacted] weeks gestation  Vaginal swabs/cultures-STD/Group B strep-[redacted] weeks gestation  Appointments every 4 weeks until 28 weeks  Every 2 weeks from 28 weeks until 36 weeks  Weekly visits from 36 weeks until delivery  Breastfeeding  Choosing to breastfeed is one of the best decisions you can make for yourself and your baby. A change in hormones during pregnancy causes your breasts to make breast milk in your milk-producing glands. Hormones prevent breast milk from being released before your baby is born. They also prompt milk flow after  birth. Once breastfeeding has begun, thoughts of your baby, as well as his or her sucking or crying, can stimulate the release of milk from your milk-producing glands. Benefits of breastfeeding Research shows that breastfeeding offers many health benefits for infants and mothers. It also offers a cost-free and convenient way to feed your baby. For your baby  Your first milk (colostrum) helps your baby's digestive system to function better.  Special cells in your milk (antibodies) help your baby to fight off infections.  Breastfed babies are less likely to develop asthma, allergies, obesity, or type 2 diabetes. They are also at lower risk for sudden infant death syndrome (SIDS).  Nutrients in breast milk are better able to meet your baby's needs compared to infant formula.  Breast milk improves your baby's brain development. For you  Breastfeeding helps to create a very special bond between you and your baby.  Breastfeeding is convenient. Breast milk costs nothing and is always available at the correct temperature.  Breastfeeding helps to burn calories. It helps you to lose the weight that you gained during pregnancy.  Breastfeeding makes your uterus return faster  to its size before pregnancy. It also slows bleeding (lochia) after you give birth.  Breastfeeding helps to lower your risk of developing type 2 diabetes, osteoporosis, rheumatoid arthritis, cardiovascular disease, and breast, ovarian, uterine, and endometrial cancer later in life. Breastfeeding basics Starting breastfeeding  Find a comfortable place to sit or lie down, with your neck and back well-supported.  Place a pillow or a rolled-up blanket under your baby to bring him or her to the level of your breast (if you are seated). Nursing pillows are specially designed to help support your arms and your baby while you breastfeed.  Make sure that your baby's tummy (abdomen) is facing your abdomen.  Gently massage your  breast. With your fingertips, massage from the outer edges of your breast inward toward the nipple. This encourages milk flow. If your milk flows slowly, you may need to continue this action during the feeding.  Support your breast with 4 fingers underneath and your thumb above your nipple (make the letter "C" with your hand). Make sure your fingers are well away from your nipple and your baby's mouth.  Stroke your baby's lips gently with your finger or nipple.  When your baby's mouth is open wide enough, quickly bring your baby to your breast, placing your entire nipple and as much of the areola as possible into your baby's mouth. The areola is the colored area around your nipple. ? More areola should be visible above your baby's upper lip than below the lower lip. ? Your baby's lips should be opened and extended outward (flanged) to ensure an adequate, comfortable latch. ? Your baby's tongue should be between his or her lower gum and your breast.  Make sure that your baby's mouth is correctly positioned around your nipple (latched). Your baby's lips should create a seal on your breast and be turned out (everted).  It is common for your baby to suck about 2-3 minutes in order to start the flow of breast milk. Latching Teaching your baby how to latch onto your breast properly is very important. An improper latch can cause nipple pain, decreased milk supply, and poor weight gain in your baby. Also, if your baby is not latched onto your nipple properly, he or she may swallow some air during feeding. This can make your baby fussy. Burping your baby when you switch breasts during the feeding can help to get rid of the air. However, teaching your baby to latch on properly is still the best way to prevent fussiness from swallowing air while breastfeeding. Signs that your baby has successfully latched onto your nipple  Silent tugging or silent sucking, without causing you pain. Infant's lips should be  extended outward (flanged).  Swallowing heard between every 3-4 sucks once your milk has started to flow (after your let-down milk reflex occurs).  Muscle movement above and in front of his or her ears while sucking. Signs that your baby has not successfully latched onto your nipple  Sucking sounds or smacking sounds from your baby while breastfeeding.  Nipple pain. If you think your baby has not latched on correctly, slip your finger into the corner of your baby's mouth to break the suction and place it between your baby's gums. Attempt to start breastfeeding again. Signs of successful breastfeeding Signs from your baby  Your baby will gradually decrease the number of sucks or will completely stop sucking.  Your baby will fall asleep.  Your baby's body will relax.  Your baby will retain a  small amount of milk in his or her mouth.  Your baby will let go of your breast by himself or herself. Signs from you  Breasts that have increased in firmness, weight, and size 1-3 hours after feeding.  Breasts that are softer immediately after breastfeeding.  Increased milk volume, as well as a change in milk consistency and color by the fifth day of breastfeeding.  Nipples that are not sore, cracked, or bleeding. Signs that your baby is getting enough milk  Wetting at least 1-2 diapers during the first 24 hours after birth.  Wetting at least 5-6 diapers every 24 hours for the first week after birth. The urine should be clear or pale yellow by the age of 5 days.  Wetting 6-8 diapers every 24 hours as your baby continues to grow and develop.  At least 3 stools in a 24-hour period by the age of 5 days. The stool should be soft and yellow.  At least 3 stools in a 24-hour period by the age of 7 days. The stool should be seedy and yellow.  No loss of weight greater than 10% of birth weight during the first 3 days of life.  Average weight gain of 4-7 oz (113-198 g) per week after the age of  4 days.  Consistent daily weight gain by the age of 5 days, without weight loss after the age of 2 weeks. After a feeding, your baby may spit up a small amount of milk. This is normal. Breastfeeding frequency and duration Frequent feeding will help you make more milk and can prevent sore nipples and extremely full breasts (breast engorgement). Breastfeed when you feel the need to reduce the fullness of your breasts or when your baby shows signs of hunger. This is called "breastfeeding on demand." Signs that your baby is hungry include:  Increased alertness, activity, or restlessness.  Movement of the head from side to side.  Opening of the mouth when the corner of the mouth or cheek is stroked (rooting).  Increased sucking sounds, smacking lips, cooing, sighing, or squeaking.  Hand-to-mouth movements and sucking on fingers or hands.  Fussing or crying. Avoid introducing a pacifier to your baby in the first 4-6 weeks after your baby is born. After this time, you may choose to use a pacifier. Research has shown that pacifier use during the first year of a baby's life decreases the risk of sudden infant death syndrome (SIDS). Allow your baby to feed on each breast as long as he or she wants. When your baby unlatches or falls asleep while feeding from the first breast, offer the second breast. Because newborns are often sleepy in the first few weeks of life, you may need to awaken your baby to get him or her to feed. Breastfeeding times will vary from baby to baby. However, the following rules can serve as a guide to help you make sure that your baby is properly fed:  Newborns (babies 73 weeks of age or younger) may breastfeed every 1-3 hours.  Newborns should not go without breastfeeding for longer than 3 hours during the day or 5 hours during the night.  You should breastfeed your baby a minimum of 8 times in a 24-hour period. Breast milk pumping Pumping and storing breast milk allows you to  make sure that your baby is exclusively fed your breast milk, even at times when you are unable to breastfeed. This is especially important if you go back to work while you are still  breastfeeding, or if you are not able to be present during feedings. Your lactation consultant can help you find a method of pumping that works best for you and give you guidelines about how long it is safe to store breast milk.      Caring for your breasts while you breastfeed Nipples can become dry, cracked, and sore while breastfeeding. The following recommendations can help keep your breasts moisturized and healthy:  Avoid using soap on your nipples.  Wear a supportive bra designed especially for nursing. Avoid wearing underwire-style bras or extremely tight bras (sports bras).  Air-dry your nipples for 3-4 minutes after each feeding.  Use only cotton bra pads to absorb leaked breast milk. Leaking of breast milk between feedings is normal.  Use lanolin on your nipples after breastfeeding. Lanolin helps to maintain your skin's normal moisture barrier. Pure lanolin is not harmful (not toxic) to your baby. You may also hand express a few drops of breast milk and gently massage that milk into your nipples and allow the milk to air-dry. In the first few weeks after giving birth, some women experience breast engorgement. Engorgement can make your breasts feel heavy, warm, and tender to the touch. Engorgement peaks within 3-5 days after you give birth. The following recommendations can help to ease engorgement:  Completely empty your breasts while breastfeeding or pumping. You may want to start by applying warm, moist heat (in the shower or with warm, water-soaked hand towels) just before feeding or pumping. This increases circulation and helps the milk flow. If your baby does not completely empty your breasts while breastfeeding, pump any extra milk after he or she is finished.  Apply ice packs to your breasts  immediately after breastfeeding or pumping, unless this is too uncomfortable for you. To do this: ? Put ice in a plastic bag. ? Place a towel between your skin and the bag. ? Leave the ice on for 20 minutes, 2-3 times a day.  Make sure that your baby is latched on and positioned properly while breastfeeding. If engorgement persists after 48 hours of following these recommendations, contact your health care provider or a Science writer. Overall health care recommendations while breastfeeding  Eat 3 healthy meals and 3 snacks every day. Well-nourished mothers who are breastfeeding need an additional 450-500 calories a day. You can meet this requirement by increasing the amount of a balanced diet that you eat.  Drink enough water to keep your urine pale yellow or clear.  Rest often, relax, and continue to take your prenatal vitamins to prevent fatigue, stress, and low vitamin and mineral levels in your body (nutrient deficiencies).  Do not use any products that contain nicotine or tobacco, such as cigarettes and e-cigarettes. Your baby may be harmed by chemicals from cigarettes that pass into breast milk and exposure to secondhand smoke. If you need help quitting, ask your health care provider.  Avoid alcohol.  Do not use illegal drugs or marijuana.  Talk with your health care provider before taking any medicines. These include over-the-counter and prescription medicines as well as vitamins and herbal supplements. Some medicines that may be harmful to your baby can pass through breast milk.  It is possible to become pregnant while breastfeeding. If birth control is desired, ask your health care provider about options that will be safe while breastfeeding your baby. Where to find more information: [employer]: www.llli.org Contact a health care provider if:  You feel like you want to stop  breastfeeding or have become frustrated with breastfeeding.  Your nipples are  cracked or bleeding.  Your breasts are red, tender, or warm.  You have: ? Painful breasts or nipples. ? A swollen area on either breast. ? A fever or chills. ? Nausea or vomiting. ? Drainage other than breast milk from your nipples.  Your breasts do not become full before feedings by the fifth day after you give birth.  You feel sad and depressed.  Your baby is: ? Too sleepy to eat well. ? Having trouble sleeping. ? More than 53 week old and wetting fewer than 6 diapers in a 24-hour period. ? Not gaining weight by 47 days of age.  Your baby has fewer than 3 stools in a 24-hour period.  Your baby's skin or the white parts of his or her eyes become yellow. Get help right away if:  Your baby is overly tired (lethargic) and does not want to wake up and feed.  Your baby develops an unexplained fever. Summary  Breastfeeding offers many health benefits for infant and mothers.  Try to breastfeed your infant when he or she shows early signs of hunger.  Gently tickle or stroke your baby's lips with your finger or nipple to allow the baby to open his or her mouth. Bring the baby to your breast. Make sure that much of the areola is in your baby's mouth. Offer one side and burp the baby before you offer the other side.  Talk with your health care provider or lactation consultant if you have questions or you face problems as you breastfeed. This information is not intended to replace advice given to you by your health care provider. Make sure you discuss any questions you have with your health care provider. Document Revised: 06/03/2017 Document Reviewed: 04/10/2016 Elsevier Patient Education  2021 [employer].

## 2020-07-17 NOTE — Progress Notes (Signed)
ROB: Patient complains of increased pelvic pressure. Notes some difficulty with standing for work for prolonged periods. Received work notes last visit. Discussed Pediatrician list again. Discussed pain management in labor. Discussed visitor policy at hospital. Patient notes having a 4D ultrasound performed. RTC in 2 weeks, for cultures at that time.

## 2020-07-18 ENCOUNTER — Telehealth: Payer: Self-pay

## 2020-07-18 NOTE — Telephone Encounter (Signed)
Received letter from [employer] stating that patient has been approved for patient assistance and meds should be shipped within 7-10 business days.  Left detailed message for patient.  Nothing further needed at this time.

## 2020-07-31 ENCOUNTER — Other Ambulatory Visit: Payer: Self-pay | Admitting: Obstetrics and Gynecology

## 2020-07-31 ENCOUNTER — Other Ambulatory Visit: Payer: Self-pay

## 2020-07-31 ENCOUNTER — Ambulatory Visit (INDEPENDENT_AMBULATORY_CARE_PROVIDER_SITE_OTHER): Payer: Managed Care, Other (non HMO) | Admitting: Obstetrics and Gynecology

## 2020-07-31 ENCOUNTER — Encounter: Payer: Self-pay | Admitting: Obstetrics and Gynecology

## 2020-07-31 VITALS — BP 108/66 | HR 94 | Wt 199.9 lb

## 2020-07-31 DIAGNOSIS — Z3A36 36 weeks gestation of pregnancy: Secondary | ICD-10-CM

## 2020-07-31 DIAGNOSIS — Z3403 Encounter for supervision of normal first pregnancy, third trimester: Secondary | ICD-10-CM

## 2020-07-31 LAB — POCT URINALYSIS DIPSTICK OB
Bilirubin, UA: NEGATIVE
Blood, UA: NEGATIVE
Glucose, UA: NEGATIVE
Ketones, UA: NEGATIVE
Leukocytes, UA: NEGATIVE
Nitrite, UA: NEGATIVE
POC,PROTEIN,UA: NEGATIVE
Spec Grav, UA: 1.01 (ref 1.010–1.025)
Urobilinogen, UA: 0.2 E.U./dL
pH, UA: 7 (ref 5.0–8.0)

## 2020-07-31 NOTE — Progress Notes (Signed)
ROB: No problems.  Signs and symptoms of labor discussed.  Cultures performed.

## 2020-08-02 LAB — STREP GP B NAA: Strep Gp B NAA: NEGATIVE

## 2020-08-03 LAB — GC/CHLAMYDIA PROBE AMP
Chlamydia trachomatis, NAA: NEGATIVE
Neisseria Gonorrhoeae by PCR: NEGATIVE

## 2020-08-08 ENCOUNTER — Other Ambulatory Visit: Payer: Self-pay

## 2020-08-08 ENCOUNTER — Ambulatory Visit (INDEPENDENT_AMBULATORY_CARE_PROVIDER_SITE_OTHER): Payer: Managed Care, Other (non HMO) | Admitting: Obstetrics and Gynecology

## 2020-08-08 ENCOUNTER — Encounter: Payer: Self-pay | Admitting: Obstetrics and Gynecology

## 2020-08-08 VITALS — BP 114/76 | HR 91 | Wt 200.2 lb

## 2020-08-08 DIAGNOSIS — Z3403 Encounter for supervision of normal first pregnancy, third trimester: Secondary | ICD-10-CM

## 2020-08-08 DIAGNOSIS — Z3A38 38 weeks gestation of pregnancy: Secondary | ICD-10-CM

## 2020-08-08 DIAGNOSIS — M549 Dorsalgia, unspecified: Secondary | ICD-10-CM

## 2020-08-08 DIAGNOSIS — O99891 Other specified diseases and conditions complicating pregnancy: Secondary | ICD-10-CM

## 2020-08-08 LAB — POCT URINALYSIS DIPSTICK OB
Bilirubin, UA: NEGATIVE
Blood, UA: NEGATIVE
Glucose, UA: NEGATIVE
Ketones, UA: NEGATIVE
Leukocytes, UA: NEGATIVE
Nitrite, UA: NEGATIVE
Spec Grav, UA: >=1.03 — AB (ref 1.010–1.025)
Urobilinogen, UA: 0.2 E.U./dL
pH, UA: 6 (ref 5.0–8.0)

## 2020-08-08 NOTE — Progress Notes (Signed)
Pt present for routine prenatal care. Pt stated having rectum/vaginal pain and pressure and was vomiting blood yesterday.

## 2020-08-08 NOTE — Patient Instructions (Signed)

## 2020-08-08 NOTE — Progress Notes (Signed)
ROB: Patient complains of moderate back pain at times. Is having difficulty moving some days, makes working harder. Reports it brought her to tears and lasted for an hour the other day.  Advised that she was at the point where she could request out of working.  Can provide letter. Given handouts on natural methods of cervical ripening. RTC in 1 week.

## 2020-08-15 ENCOUNTER — Other Ambulatory Visit: Payer: Self-pay

## 2020-08-15 ENCOUNTER — Ambulatory Visit (INDEPENDENT_AMBULATORY_CARE_PROVIDER_SITE_OTHER): Payer: Managed Care, Other (non HMO) | Admitting: Obstetrics and Gynecology

## 2020-08-15 ENCOUNTER — Encounter: Payer: Self-pay | Admitting: Obstetrics and Gynecology

## 2020-08-15 VITALS — BP 105/73 | HR 98 | Wt 203.6 lb

## 2020-08-15 DIAGNOSIS — Z3A39 39 weeks gestation of pregnancy: Secondary | ICD-10-CM

## 2020-08-15 DIAGNOSIS — Z3403 Encounter for supervision of normal first pregnancy, third trimester: Secondary | ICD-10-CM

## 2020-08-15 LAB — POCT URINALYSIS DIPSTICK OB
Bilirubin, UA: NEGATIVE
Blood, UA: NEGATIVE
Glucose, UA: NEGATIVE
Ketones, UA: NEGATIVE
Leukocytes, UA: NEGATIVE
Nitrite, UA: NEGATIVE
POC,PROTEIN,UA: NEGATIVE
Spec Grav, UA: 1.01 (ref 1.010–1.025)
Urobilinogen, UA: 0.2 E.U./dL
pH, UA: 6.5 (ref 5.0–8.0)

## 2020-08-15 NOTE — Progress Notes (Signed)
ROB: Doing well.  Thinks she may have passed her mucous plug.  Denies contractions.  Reports daily fetal movement.  Postdates induction again discussed.

## 2020-08-19 ENCOUNTER — Observation Stay
Admission: EM | Admit: 2020-08-19 | Discharge: 2020-08-19 | Disposition: A | Discharge status: Home / Self Care | Payer: Managed Care, Other (non HMO) | Attending: Obstetrics and Gynecology | Admitting: Obstetrics and Gynecology

## 2020-08-19 ENCOUNTER — Other Ambulatory Visit: Payer: Self-pay

## 2020-08-19 ENCOUNTER — Encounter: Payer: Self-pay | Admitting: Obstetrics and Gynecology

## 2020-08-19 DIAGNOSIS — Z3A39 39 weeks gestation of pregnancy: Secondary | ICD-10-CM | POA: Insufficient documentation

## 2020-08-19 DIAGNOSIS — O368130 Decreased fetal movements, third trimester, not applicable or unspecified: Secondary | ICD-10-CM | POA: Diagnosis not present

## 2020-08-19 DIAGNOSIS — O368131 Decreased fetal movements, third trimester, fetus 1: Secondary | ICD-10-CM | POA: Diagnosis present

## 2020-08-19 NOTE — Final Progress Note (Signed)
L&D OB Triage Note  Carly Williams is a 20 y.o. G1P0000 female at [redacted]w[redacted]d, EDD Estimated Date of Delivery: 08/22/20 who presented to triage for complaints of decreased fetal movement for several hours this morning.  She was evaluated by the nurses with no significant findings. Vital signs stable. An NST was performed and has been reviewed by MD.   NST INTERPRETATION: Indications: decreased fetal movement  Mode: External Baseline Rate (A): 135 bpm Variability: Moderate Accelerations: 15 x 15 Decelerations: None     Contraction Frequency (min): Occasional  Impression: reactive   Plan: NST performed was reviewed and was found to be reactive. She was discharged home with labor precautions, fetal kick count instructions.  Continue routine prenatal care. Follow up with OB/GYN as previously scheduled.     Hildred Laser, MD  [facility]

## 2020-08-19 NOTE — OB Triage Note (Signed)
Patient is [redacted]w[redacted]d that is G1P0, comes in for decreased fetal movement. Patient states that she has not felt the baby move "as much" over the past 2-3 hours. Patient denies any LOF or bleeding. Pt denies CTX. No pain. VSS. Monitors applied and assessing.

## 2020-08-19 NOTE — Progress Notes (Signed)
Patient's discharge instructions reviewed and patient verbalizes understanding. Patient's questions answered. Patient states that she felt the baby move since she has been here. Patient ambulatory home with mother in good condition.

## 2020-08-22 ENCOUNTER — Encounter: Payer: Self-pay | Admitting: Obstetrics and Gynecology

## 2020-08-22 ENCOUNTER — Inpatient Hospital Stay: Admit: 2020-08-22 | Payer: Self-pay

## 2020-08-22 ENCOUNTER — Ambulatory Visit (INDEPENDENT_AMBULATORY_CARE_PROVIDER_SITE_OTHER): Payer: Managed Care, Other (non HMO) | Admitting: Obstetrics and Gynecology

## 2020-08-22 ENCOUNTER — Other Ambulatory Visit: Payer: Self-pay

## 2020-08-22 VITALS — BP 114/77 | HR 87 | Wt 204.7 lb

## 2020-08-22 DIAGNOSIS — Z3A40 40 weeks gestation of pregnancy: Secondary | ICD-10-CM

## 2020-08-22 DIAGNOSIS — Z3403 Encounter for supervision of normal first pregnancy, third trimester: Secondary | ICD-10-CM

## 2020-08-22 LAB — POCT URINALYSIS DIPSTICK OB
Bilirubin, UA: NEGATIVE
Blood, UA: NEGATIVE
Glucose, UA: NEGATIVE
Ketones, UA: NEGATIVE
Leukocytes, UA: NEGATIVE
Nitrite, UA: NEGATIVE
POC,PROTEIN,UA: NEGATIVE
Spec Grav, UA: 1.02 (ref 1.010–1.025)
Urobilinogen, UA: 0.2 E.U./dL
pH, UA: 6.5 (ref 5.0–8.0)

## 2020-08-22 NOTE — Patient Instructions (Signed)

## 2020-08-22 NOTE — Progress Notes (Signed)
OB-Pt present for routine prenatal care. Pt c/o pelvic pain, vaginal pressure, swollen feet and face.

## 2020-08-22 NOTE — Progress Notes (Signed)
ROB: Patient notes that she is "over this pregnancy". Has nausea, pelvic pain, swelling of feet/face. Patient and family member desire to discuss induction. Recommend IOL closer to 41 weeks as most will enter spontaneous labor. Offered membrane sweeping today.  Will schedule IOL for 08/28/20 at 0800 if no spontaneous labor. Can consider foley bulb placement on day prior. Given labor precautions. RTC next week if undelivered. Will need NST for postdates.

## 2020-08-24 ENCOUNTER — Observation Stay (HOSPITAL_BASED_OUTPATIENT_CLINIC_OR_DEPARTMENT_OTHER)
Admission: EM | Admit: 2020-08-24 | Discharge: 2020-08-24 | Disposition: A | Discharge status: Home / Self Care | Payer: Managed Care, Other (non HMO) | Source: Home / Self Care | Admitting: Obstetrics and Gynecology

## 2020-08-24 ENCOUNTER — Other Ambulatory Visit: Payer: Self-pay

## 2020-08-24 ENCOUNTER — Encounter: Payer: Self-pay | Admitting: Obstetrics and Gynecology

## 2020-08-24 DIAGNOSIS — R1030 Lower abdominal pain, unspecified: Secondary | ICD-10-CM | POA: Diagnosis not present

## 2020-08-24 DIAGNOSIS — O368331 Maternal care for abnormalities of the fetal heart rate or rhythm, third trimester, fetus 1: Secondary | ICD-10-CM | POA: Insufficient documentation

## 2020-08-24 DIAGNOSIS — O26893 Other specified pregnancy related conditions, third trimester: Secondary | ICD-10-CM | POA: Diagnosis not present

## 2020-08-24 DIAGNOSIS — Z3490 Encounter for supervision of normal pregnancy, unspecified, unspecified trimester: Secondary | ICD-10-CM

## 2020-08-24 DIAGNOSIS — Z3A40 40 weeks gestation of pregnancy: Secondary | ICD-10-CM

## 2020-08-24 DIAGNOSIS — O471 False labor at or after 37 completed weeks of gestation: Secondary | ICD-10-CM | POA: Insufficient documentation

## 2020-08-24 NOTE — OB Triage Note (Signed)
Pt discharged in good condition w/ mother. Teach back method used. RN at bedside for discharge instructions. Pt instructed to come back with increase ctx, vag bleeding, LOF, decreased fetal movement.

## 2020-08-24 NOTE — OB Triage Note (Signed)
Pt G1P0 [redacted]w[redacted]d presents to birthplace w/ c/o ctx for the last 3 hours. Rating them 6/10. Denies LOF, vag bleeding, and reports +FM. VSS. Monitors applied and assessing. FHT's in 140's.

## 2020-08-25 ENCOUNTER — Other Ambulatory Visit: Payer: Self-pay

## 2020-08-25 ENCOUNTER — Inpatient Hospital Stay: Payer: Managed Care, Other (non HMO) | Admitting: Certified Registered Nurse Anesthetist

## 2020-08-25 ENCOUNTER — Encounter: Payer: Self-pay | Admitting: Obstetrics and Gynecology

## 2020-08-25 ENCOUNTER — Inpatient Hospital Stay
Admission: EM | Admit: 2020-08-25 | Discharge: 2020-08-26 | DRG: 807 | Disposition: A | Discharge status: Home / Self Care | Payer: Managed Care, Other (non HMO) | Attending: Obstetrics and Gynecology | Admitting: Obstetrics and Gynecology

## 2020-08-25 DIAGNOSIS — J4520 Mild intermittent asthma, uncomplicated: Secondary | ICD-10-CM | POA: Diagnosis present

## 2020-08-25 DIAGNOSIS — O26893 Other specified pregnancy related conditions, third trimester: Secondary | ICD-10-CM | POA: Diagnosis present

## 2020-08-25 DIAGNOSIS — Z3A40 40 weeks gestation of pregnancy: Secondary | ICD-10-CM | POA: Diagnosis not present

## 2020-08-25 DIAGNOSIS — O9952 Diseases of the respiratory system complicating childbirth: Principal | ICD-10-CM | POA: Diagnosis present

## 2020-08-25 DIAGNOSIS — Z8616 Personal history of COVID-19: Secondary | ICD-10-CM | POA: Diagnosis not present

## 2020-08-25 LAB — TYPE AND SCREEN
ABO/RH(D): O POS
Antibody Screen: NEGATIVE

## 2020-08-25 LAB — CBC
HCT: 35.4 % — ABNORMAL LOW (ref 36.0–46.0)
Hemoglobin: 12 g/dL (ref 12.0–15.0)
MCH: 29.1 pg (ref 26.0–34.0)
MCHC: 33.9 g/dL (ref 30.0–36.0)
MCV: 85.9 fL (ref 80.0–100.0)
Platelets: 228 10*3/uL (ref 150–400)
RBC: 4.12 MIL/uL (ref 3.87–5.11)
RDW: 13.6 % (ref 11.5–15.5)
WBC: 18 10*3/uL — ABNORMAL HIGH (ref 4.0–10.5)
nRBC: 0 % (ref 0.0–0.2)

## 2020-08-25 LAB — ABO/RH: ABO/RH(D): O POS

## 2020-08-25 MED ORDER — PRENATAL MULTIVITAMIN CH
1.0000 | ORAL_TABLET | Freq: Every day | ORAL | Status: DC
  Administered 2020-08-26: 1 via ORAL
  Filled 2020-08-25: qty 1

## 2020-08-25 MED ORDER — OXYTOCIN-SODIUM CHLORIDE 30-0.9 UT/500ML-% IV SOLN
2.5000 [IU]/h | INTRAVENOUS | Status: DC
  Administered 2020-08-25: 2.5 [IU]/h via INTRAVENOUS
  Filled 2020-08-25: qty 500

## 2020-08-25 MED ORDER — OXYTOCIN-SODIUM CHLORIDE 30-0.9 UT/500ML-% IV SOLN
1.0000 m[IU]/min | INTRAVENOUS | Status: DC
Start: 2020-08-25 — End: 2020-08-25
  Administered 2020-08-25: 2 m[IU]/min via INTRAVENOUS
  Filled 2020-08-25: qty 500

## 2020-08-25 MED ORDER — FENTANYL 2.5 MCG/ML W/ROPIVACAINE 0.15% IN NS 100 ML EPIDURAL (ARMC)
12.0000 mL/h | EPIDURAL | Status: DC
  Administered 2020-08-25: 12 mL/h via EPIDURAL

## 2020-08-25 MED ORDER — OXYCODONE-ACETAMINOPHEN 5-325 MG PO TABS: 1.0000 | ORAL_TABLET | ORAL | Status: DC | PRN

## 2020-08-25 MED ORDER — OXYTOCIN BOLUS FROM INFUSION
333.0000 mL | Freq: Once | INTRAVENOUS | Status: AC
  Administered 2020-08-25: 333 mL via INTRAVENOUS

## 2020-08-25 MED ORDER — LIDOCAINE HCL (PF) 1 % IJ SOLN
INTRAMUSCULAR | Status: DC | PRN
  Administered 2020-08-25 (×2): 3 mL via SUBCUTANEOUS

## 2020-08-25 MED ORDER — IBUPROFEN 600 MG PO TABS
600.0000 mg | ORAL_TABLET | Freq: Four times a day (QID) | ORAL | Status: DC
  Administered 2020-08-26 (×3): 600 mg via ORAL
  Filled 2020-08-25 (×3): qty 1

## 2020-08-25 MED ORDER — BUTORPHANOL TARTRATE 1 MG/ML IJ SOLN
1.0000 mg | INTRAMUSCULAR | Status: DC | PRN
  Administered 2020-08-25: 1 mg via INTRAVENOUS
  Filled 2020-08-25: qty 1

## 2020-08-25 MED ORDER — PHENYLEPHRINE 40 MCG/ML (10ML) SYRINGE FOR IV PUSH (FOR BLOOD PRESSURE SUPPORT): 80.0000 ug | PREFILLED_SYRINGE | INTRAVENOUS | Status: DC | PRN

## 2020-08-25 MED ORDER — TERBUTALINE SULFATE 1 MG/ML IJ SOLN: 0.2500 mg | Freq: Once | INTRAMUSCULAR | Status: DC | PRN

## 2020-08-25 MED ORDER — TETANUS-DIPHTH-ACELL PERTUSSIS 5-2.5-18.5 LF-MCG/0.5 IM SUSY
0.5000 mL | PREFILLED_SYRINGE | Freq: Once | INTRAMUSCULAR | Status: DC
  Filled 2020-08-25: qty 0.5

## 2020-08-25 MED ORDER — IBUPROFEN 600 MG PO TABS
600.0000 mg | ORAL_TABLET | Freq: Four times a day (QID) | ORAL | Status: DC | PRN
  Administered 2020-08-25: 600 mg via ORAL

## 2020-08-25 MED ORDER — LACTATED RINGERS IV SOLN: INTRAVENOUS | Status: DC

## 2020-08-25 MED ORDER — ONDANSETRON HCL 4 MG/2ML IJ SOLN: 4.0000 mg | Freq: Four times a day (QID) | INTRAMUSCULAR | Status: DC | PRN

## 2020-08-25 MED ORDER — FENTANYL 2.5 MCG/ML W/ROPIVACAINE 0.15% IN NS 100 ML EPIDURAL (ARMC)
EPIDURAL | Status: DC | PRN
  Administered 2020-08-25: 12 mL/h via EPIDURAL

## 2020-08-25 MED ORDER — MISOPROSTOL 200 MCG PO TABS
ORAL_TABLET | ORAL | Status: AC
  Filled 2020-08-25: qty 4

## 2020-08-25 MED ORDER — OXYTOCIN-SODIUM CHLORIDE 30-0.9 UT/500ML-% IV SOLN
2.5000 [IU]/h | INTRAVENOUS | Status: DC | PRN
  Administered 2020-08-25: 2.5 [IU]/h via INTRAVENOUS

## 2020-08-25 MED ORDER — DOCUSATE SODIUM 100 MG PO CAPS
100.0000 mg | ORAL_CAPSULE | Freq: Two times a day (BID) | ORAL | Status: DC
  Administered 2020-08-25 – 2020-08-26 (×2): 100 mg via ORAL
  Filled 2020-08-25 (×2): qty 1

## 2020-08-25 MED ORDER — SODIUM CHLORIDE 0.9 % IV SOLN: 1.0000 g | INTRAVENOUS | Status: DC

## 2020-08-25 MED ORDER — ACETAMINOPHEN 325 MG PO TABS
650.0000 mg | ORAL_TABLET | ORAL | Status: DC | PRN
  Administered 2020-08-25: 650 mg via ORAL

## 2020-08-25 MED ORDER — FENTANYL 2.5 MCG/ML W/ROPIVACAINE 0.15% IN NS 100 ML EPIDURAL (ARMC)
EPIDURAL | Status: AC
  Filled 2020-08-25: qty 100

## 2020-08-25 MED ORDER — EPHEDRINE 5 MG/ML INJ: 10.0000 mg | INTRAVENOUS | Status: DC | PRN

## 2020-08-25 MED ORDER — LACTATED RINGERS IV SOLN: 500.0000 mL | INTRAVENOUS | Status: DC | PRN

## 2020-08-25 MED ORDER — OXYTOCIN 10 UNIT/ML IJ SOLN
INTRAMUSCULAR | Status: AC
  Filled 2020-08-25: qty 2

## 2020-08-25 MED ORDER — ACETAMINOPHEN 325 MG PO TABS
650.0000 mg | ORAL_TABLET | ORAL | Status: DC | PRN
  Filled 2020-08-25: qty 2

## 2020-08-25 MED ORDER — LACTATED RINGERS IV SOLN
500.0000 mL | Freq: Once | INTRAVENOUS | Status: AC
  Administered 2020-08-25: 500 mL via INTRAVENOUS

## 2020-08-25 MED ORDER — ZOLPIDEM TARTRATE 5 MG PO TABS: 5.0000 mg | ORAL_TABLET | Freq: Every evening | ORAL | Status: DC | PRN

## 2020-08-25 MED ORDER — BUPIVACAINE HCL (PF) 0.25 % IJ SOLN
INTRAMUSCULAR | Status: DC | PRN
  Administered 2020-08-25: 3 mL via EPIDURAL
  Administered 2020-08-25: 2 mL via EPIDURAL
  Administered 2020-08-25: 5 mL via EPIDURAL

## 2020-08-25 MED ORDER — BENZOCAINE-MENTHOL 20-0.5 % EX AERO
1.0000 "application " | INHALATION_SPRAY | CUTANEOUS | Status: DC | PRN
  Filled 2020-08-25 (×2): qty 56

## 2020-08-25 MED ORDER — DIPHENHYDRAMINE HCL 50 MG/ML IJ SOLN: 12.5000 mg | INTRAMUSCULAR | Status: DC | PRN

## 2020-08-25 MED ORDER — SIMETHICONE 80 MG PO CHEW: 80.0000 mg | CHEWABLE_TABLET | ORAL | Status: DC | PRN

## 2020-08-25 MED ORDER — DIPHENHYDRAMINE HCL 25 MG PO CAPS: 25.0000 mg | ORAL_CAPSULE | Freq: Four times a day (QID) | ORAL | Status: DC | PRN

## 2020-08-25 MED ORDER — LIDOCAINE HCL (PF) 1 % IJ SOLN
INTRAMUSCULAR | Status: AC
  Administered 2020-08-25: 30 mL via SUBCUTANEOUS
  Filled 2020-08-25: qty 30

## 2020-08-25 MED ORDER — SODIUM CHLORIDE 0.9 % IV SOLN: 2.0000 g | Freq: Once | INTRAVENOUS | Status: DC

## 2020-08-25 MED ORDER — LIDOCAINE-EPINEPHRINE (PF) 1.5 %-1:200000 IJ SOLN
INTRAMUSCULAR | Status: DC | PRN
  Administered 2020-08-25: 4 mL via EPIDURAL
  Administered 2020-08-25: 3 mL via EPIDURAL

## 2020-08-25 MED ORDER — IBUPROFEN 600 MG PO TABS
600.0000 mg | ORAL_TABLET | Freq: Four times a day (QID) | ORAL | Status: DC
  Filled 2020-08-25: qty 1

## 2020-08-25 MED ORDER — LIDOCAINE HCL (PF) 1 % IJ SOLN
30.0000 mL | INTRAMUSCULAR | Status: AC | PRN
Start: 2020-08-25 — End: 2020-08-25

## 2020-08-25 MED ORDER — AMMONIA AROMATIC IN INHA
RESPIRATORY_TRACT | Status: AC
  Filled 2020-08-25: qty 10

## 2020-08-25 MED ORDER — SOD CITRATE-CITRIC ACID 500-334 MG/5ML PO SOLN: 30.0000 mL | ORAL | Status: DC | PRN

## 2020-08-25 NOTE — Anesthesia Procedure Notes (Signed)
Epidural Patient location during procedure: OB  Staffing Performed: anesthesiologist   Preanesthetic Checklist Completed: patient identified, IV checked, site marked, risks and benefits discussed, surgical consent, monitors and equipment checked, pre-op evaluation and timeout performed  Epidural Patient position: sitting Prep: Betadine Patient monitoring: heart rate, continuous pulse ox and blood pressure Approach: midline Location: L4-L5 Injection technique: LOR saline  Needle:  Needle type: Tuohy  Needle gauge: 17 G Needle length: 9 cm and 9 Needle insertion depth: 5 cm Catheter type: closed end flexible Catheter size: 20 Guage Test dose: negative and 1.5% lidocaine with Epi 1:200 K  Assessment Sensory level: T10 Events: blood not aspirated, injection not painful, no injection resistance, no paresthesia and negative IV test  Additional Notes   Patient tolerated the insertion well without complications.-SATD -IVTD. No paresthesia. Refer to [facility] nursing for VS and dosingReason for block:procedure for pain
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
## 2020-08-25 NOTE — Progress Notes (Signed)
Pt experiencing left sided hip pain after epidural and rating pain 10/10. Pt was positioned on the left side with peanut ball and bolus button pressed twice with no relief. Pt continuously reports 10/10 pain in left hip area and is involuntary pushing with each contractions. RN educated pt to not push with a 6cm cervix and the risks associated with it. Pt verbalized understanding and requested pain additional pain relief.  Dr. Pernell Dupre notified and came to bedside to reasses epidural. Epidural was replaced by Dr. Pernell Dupre and pt is reporting 0/10 pain and is resting comfortably.

## 2020-08-25 NOTE — OB Triage Note (Signed)
Pt is a G1P0 and [redacted]w[redacted]d presenting to L&D with c/o ctx that have worsened since 0400 today. Pt states they have "consistently been 3 minutes apart since 0400." Pt rates them 9/10 on a 0-10 pain scale. Pt reports vomiting "a few times" in the last several hours. Pt sent home earlier in the night from triage. Pt denies VB, LOF and confirms positive fetal movement. Monitors applied and assessing. VSS.

## 2020-08-25 NOTE — Discharge Summary (Signed)
    L&D OB Triage Note  SUBJECTIVE Carly Williams is a 20 y.o. G1P0000 female at [redacted]w[redacted]d, EDD Estimated Date of Delivery: 08/22/20 who presented to triage with complaints of abdominal pain/contractions.   OB History  Gravida Para Term Preterm AB Living  1 0 0 0 0 0  SAB IAB Ectopic Multiple Live Births  0 0 0 0 0    # Outcome Date GA Lbr Len/2nd Weight Sex Delivery Anes PTL Lv  1 Current             Facility-Administered Medications Prior to Admission  Medication Dose Route Frequency Provider Last Rate Last Admin  . Tdap (BOOSTRIX) injection 0.5 mL  0.5 mL Intramuscular Once Linzie Collin, MD       Medications Prior to Admission  Medication Sig Dispense Refill Last Dose  . albuterol (VENTOLIN HFA) 108 (90 Base) MCG/ACT inhaler Inhale 2 puffs into the lungs every 6 (six) hours as needed for wheezing or shortness of breath. 18 g 5   . budesonide-formoterol (SYMBICORT) 80-4.5 MCG/ACT inhaler Inhale 2 puffs into the lungs in the morning and at bedtime. 1 each 6   . Prenatal MV & Min w/FA-DHA (ONE A DAY PRENATAL) 0.4-25 MG CHEW Chew by mouth.   08/23/2020 at Unknown time     OBJECTIVE  Nursing Evaluation:   BP 117/70   Pulse (!) 102   Temp 99 F (37.2 C) (Oral)   Resp 18   LMP 11/16/2019 (Exact Date)    Findings:        No cervical change - irregular contractions.      NST was performed and has been reviewed by me.  NST INTERPRETATION: Category I  Mode: External Baseline Rate (A): 140 bpm Variability: Moderate Accelerations: 15 x 15 Decelerations: None     Contraction Frequency (min): 3-6  ASSESSMENT Impression:  1.  Pregnancy:  G1P0000 at [redacted]w[redacted]d , EDD Estimated Date of Delivery: 08/22/20 2.  Reassuring fetal and maternal status  PLAN 1. Discussed current condition and above findings with patient and reassurance given.  All questions answered. 2. Discharge home with standard labor precautions given to return to L&D or call the office for problems. 3. Continue  routine prenatal care.

## 2020-08-25 NOTE — Anesthesia Preprocedure Evaluation (Signed)
Anesthesia Evaluation  Patient identified by MRN, date of birth, ID band Patient awake    Reviewed: Allergy & Precautions, H&P , NPO status , Patient's Chart, lab work & pertinent test results, reviewed documented beta blocker date and time   Airway Mallampati: III  TM Distance: >3 FB Neck ROM: full    Dental no notable dental hx. (+) Teeth Intact   Pulmonary asthma , Current Smoker,    Pulmonary exam normal breath sounds clear to auscultation       Cardiovascular Exercise Tolerance: Good negative cardio ROS   Rhythm:regular Rate:Normal     Neuro/Psych  Headaches, PSYCHIATRIC DISORDERS Depression    GI/Hepatic negative GI ROS, Neg liver ROS,   Endo/Other  negative endocrine ROSdiabetes  Renal/GU      Musculoskeletal   Abdominal   Peds  Hematology negative hematology ROS (+)   Anesthesia Other Findings   Reproductive/Obstetrics (+) Pregnancy                             Anesthesia Physical Anesthesia Plan  ASA: II  Anesthesia Plan: Epidural   Post-op Pain Management:    Induction:   PONV Risk Score and Plan:   Airway Management Planned:   Additional Equipment:   Intra-op Plan:   Post-operative Plan:   Informed Consent: I have reviewed the patients History and Physical, chart, labs and discussed the procedure including the risks, benefits and alternatives for the proposed anesthesia with the patient or authorized representative who has indicated his/her understanding and acceptance.       Plan Discussed with:   Anesthesia Plan Comments:         Anesthesia Quick Evaluation

## 2020-08-25 NOTE — Anesthesia Procedure Notes (Signed)
Epidural Patient location during procedure: OB  Staffing Performed: anesthesiologist   Preanesthetic Checklist Completed: patient identified, IV checked, site marked, risks and benefits discussed, surgical consent, monitors and equipment checked, pre-op evaluation and timeout performed  Epidural Patient position: sitting Prep: Betadine Patient monitoring: heart rate, continuous pulse ox and blood pressure Approach: midline Location: L3-L4 Injection technique: LOR saline  Needle:  Needle type: Tuohy  Needle gauge: 17 G Needle length: 9 cm and 9 Needle insertion depth: 6 cm Catheter type: closed end flexible Catheter size: 20 Guage Catheter at skin depth: 12 cm Test dose: negative and 1.5% lidocaine with Epi 1:200 K  Assessment Sensory level: T10 Events: blood not aspirated, injection not painful, no injection resistance, no paresthesia and negative IV test  Additional Notes As requested by patient, Decision to replace initial catheter secondary to left side hot spot with predominantly right sided block. Ctoi. Single pass and easy placement with good T10 Bil block to follow  Patient tolerated the insertion well without complications.-SATD -IVTD. No paresthesia. Refer to [facility] nursing for VS and dosingReason for block:procedure for pain

## 2020-08-26 LAB — RPR: RPR Ser Ql: NONREACTIVE

## 2020-08-26 MED ORDER — IBUPROFEN 600 MG PO TABS: 600.0000 mg | ORAL_TABLET | Freq: Four times a day (QID) | ORAL | 0 refills | Status: DC

## 2020-08-26 NOTE — Discharge Summary (Signed)
Patient Name: Carly Williams DOB: 02-27-2001 MRN: [id number]                            Discharge Summary  Date of Admission: 08/25/2020 Date of Discharge: 08/26/2020 Delivering Provider: Linzie Collin   Admitting Diagnosis: Normal labor [O80, Z37.9] at [redacted]w[redacted]d Secondary diagnosis:  Active Problems:   Normal labor   Mode of Delivery: normal spontaneous vaginal delivery              Discharge diagnosis: Term Pregnancy Delivered      Intrapartum Procedures: Atificial rupture of membranes, epidural and pitocin augmentation   Post partum procedures:   Complications: 2nd degree perineal laceration and right labial laceration                     Discharge Day SOAP Note:  Progress Note - Vaginal Delivery  Carly Williams is a 20 y.o. G1P1001 now PP day 1 s/p Vaginal, Spontaneous . Delivery was uncomplicated  Subjective  The patient has the following complaints: has no unusual complaints  Pain is controlled with current medications.   Patient is urinating without difficulty.  She is ambulating well.   She is requesting discharge.  Objective  Vital signs: BP 99/65 (BP Location: Right Arm)   Pulse 100   Temp 99.1 F (37.3 C) (Oral)   Resp 18   Ht 5\' 1"  (1.549 m)   Wt 93 kg   LMP 11/16/2019 (Exact Date)   SpO2 99%   Breastfeeding Unknown   BMI 38.72 kg/m   Physical Exam: Gen: NAD Fundus Fundal Tone: Firm  Lochia Amount: Scant        Data Review Labs: Lab Results  Component Value Date   WBC 18.0 (H) 08/25/2020   HGB 12.0 08/25/2020   HCT 35.4 (L) 08/25/2020   MCV 85.9 08/25/2020   PLT 228 08/25/2020   CBC Latest Ref Rng & Units 08/25/2020 05/29/2020 03/19/2020  WBC 4.0 - 10.5 K/uL 18.0(H) 12.8(H) 9.3  Hemoglobin 12.0 - 15.0 g/dL 03/21/2020 19.3 79.0  Hematocrit 36.0 - 46.0 % 35.4(L) 34.3 39.1  Platelets 150 - 400 K/uL 228 220 260   O POS Performed at [facility], [address] [place], [place]   [id number] Score: Inocente Salles  Postnatal Depression Scale Screening Tool 08/25/2020  I have been able to laugh and see the funny side of things. 0  I have looked forward with enjoyment to things. 0  I have blamed myself unnecessarily when things went wrong. 0  I have been anxious or worried for no good reason. 1  I have felt scared or panicky for no good reason. 0  Things have been getting on top of me. 1  I have been so unhappy that I have had difficulty sleeping. 0  I have felt sad or miserable. 1  I have been so unhappy that I have been crying. 2  The thought of harming myself has occurred to me. 0  Edinburgh Postnatal Depression Scale Total 5    Assessment/Plan  Active Problems:   Normal labor    Plan for discharge today.  Discharge Instructions: Per After Visit Summary. Activity: Advance as tolerated. Pelvic rest for 6 weeks.  Also refer to After Visit Summary Diet: Regular Medications: Allergies as of 08/26/2020      Reactions   Metronidazole Nausea And Vomiting      Medication List  TAKE these medications   albuterol 108 (90 Base) MCG/ACT inhaler Commonly known as: VENTOLIN HFA Inhale 2 puffs into the lungs every 6 (six) hours as needed for wheezing or shortness of breath.   budesonide-formoterol 80-4.5 MCG/ACT inhaler Commonly known as: Symbicort Inhale 2 puffs into the lungs in the morning and at bedtime.   ibuprofen 600 MG tablet Commonly known as: ADVIL Take 1 tablet (600 mg total) by mouth 4 (four) times daily.   One A Day Prenatal 0.4-25 MG Chew Chew by mouth.      Outpatient follow up:   Follow-up Information    Linzie Collin, MD Follow up in 4 week(s).   Specialties: Obstetrics and Gynecology, Radiology Contact information: [address] [place] [address] [phone]              Postpartum contraception: Will discuss at first office visit post-partum  Discharged Condition: good  Discharged to: home  Newborn Data: Disposition:home  with mother  Apgars: APGAR (1 MIN): 8   APGAR (5 MINS): 9   APGAR (10 MINS):    Baby Feeding: Breast    Elonda Husky, M.D. 08/26/2020 8:23 AM

## 2020-08-26 NOTE — Discharge Instructions (Signed)
Postpartum Care After Vaginal Delivery The following information offers guidance about how to care for yourself from the time you deliver your baby to 6-12 weeks after delivery (postpartum period). If you have problems or questions, contact your health care provider for more specific instructions. Follow these instructions at home: Vaginal bleeding  It is normal to have vaginal bleeding (lochia) after delivery. Wear a sanitary pad for bleeding and discharge. ? During the first week after delivery, the amount and appearance of lochia is often similar to a menstrual period. ? Over the next few weeks, it will gradually decrease to a dry, yellow-brown discharge. ? For most women, lochia stops completely by 4-6 weeks after delivery, but can vary.  Change your sanitary pads frequently. Watch for any changes in your flow, such as: ? A sudden increase in volume. ? A change in color. ? Large blood clots.  If you pass a blood clot from your vagina, save it and call your health care provider. Do not flush blood clots down the toilet before talking with your health care provider.  Do not use tampons or douches until your health care provider approves.  If you are not breastfeeding, your period should return 6-8 weeks after delivery. If you are feeding your baby breast milk only, your period may not return until you stop breastfeeding. Perineal care  Keep the area between the vagina and the anus (perineum) clean and dry. Use medicated pads and pain-relieving sprays and creams as directed.  If you had a surgical cut in the perineum (episiotomy) or a tear, check the area for signs of infection until you are healed. Check for: ? More redness, swelling, or pain. ? Fluid or blood coming from the cut or tear. ? Warmth. ? Pus or a bad smell.  You may be given a squirt bottle to use instead of wiping to clean the perineum area after you use the bathroom. Pat the area gently to dry it.  To relieve pain  caused by an episiotomy, a tear, or swollen veins in the anus (hemorrhoids), take a warm sitz bath 2-3 times a day. In a sitz bath, the warm water should only come up to your hips and cover your buttocks.   Breast care  In the first few days after delivery, your breasts may feel heavy, full, and uncomfortable (breast engorgement). Milk may also leak from your breasts. Ask your health care provider about ways to help relieve the discomfort.  If you are breastfeeding: ? Wear a bra that supports your breasts and fits well. Use breast pads to absorb milk that leaks. ? Keep your nipples clean and dry. Apply creams and ointments as told. ? You may have uterine contractions every time you breastfeed for up to several weeks after delivery. This helps your uterus return to its normal size. ? If you have any problems with breastfeeding, notify your health care provider or lactation consultant.  If you are not breastfeeding: ? Avoid touching your breasts. Do not squeeze out (express) milk. Doing this can make your breasts produce more milk. ? Wear a good-fitting bra and use cold packs to help with swelling. Intimacy and sexuality  Ask your health care provider when you can engage in sexual activity. This may depend upon: ? Your risk of infection. ? How fast you are healing. ? Your comfort and desire to engage in sexual activity.  You are able to get pregnant after delivery, even if you have not had your period. Talk with   your health care provider about methods of birth control (contraception) or family planning if you desire future pregnancies. Medicines  Take over-the-counter and prescription medicines only as told by your health care provider.  Take an over-the-counter stool softener to help ease bowel movements as told by your health care provider.  If you were prescribed an antibiotic medicine, take it as told by your health care provider. Do not stop taking the antibiotic even if you start to  feel better.  Review all previous and current prescriptions to check for possible transfer into breast milk. Activity  Gradually return to your normal activities as told by your health care provider.  Rest as much as possible. Nap while your baby is sleeping. Eating and drinking  Drink enough fluid to keep your urine pale yellow.  To help prevent or relieve constipation, eat high-fiber foods every day.  Choose healthy eating to support breastfeeding or weight loss goals.  Take your prenatal vitamins until your health care provider tells you to stop.   General tips/recommendations  Do not use any products that contain nicotine or tobacco. These products include cigarettes, chewing tobacco, and vaping devices, such as e-cigarettes. If you need help quitting, ask your health care provider.  Do not drink alcohol, especially if you are breastfeeding.  Do not take medications or drugs that are not prescribed to you, especially if you are breastfeeding.  Visit your health care provider for a postpartum checkup within the first 3-6 weeks after delivery.  Complete a comprehensive postpartum visit no later than 12 weeks after delivery.  Keep all follow-up visits for you and your baby. Contact a health care provider if:  You feel unusually sad or worried.  Your breasts become red, painful, or hard.  You have a fever or other signs of an infection.  You have bleeding that is soaking through one pad an hour or you have blood clots.  You have a severe headache that doesn't go away or you have vision changes.  You have nausea and vomiting and are unable to eat or drink anything for 24 hours. Get help right away if:  You have chest pain or difficulty breathing.  You have sudden, severe leg pain.  You faint or have a seizure.  You have thoughts about hurting yourself or your baby. If you ever feel like you may hurt yourself or others, or have thoughts about taking your own life,  get help right away. Go to your nearest emergency department or:  Call your local emergency services (911 in the [place]  The National Suicide Prevention Lifeline at (279) 668-8883. This suicide crisis helpline is open 24 hours a day.  Text the Crisis Text Line at 202-742-5257 (in the [place] Summary  The period of time after you deliver your newborn up to 6-12 weeks after delivery is called the postpartum period.  Keep all follow-up visits for you and your baby.  Review all previous and current prescriptions to check for possible transfer into breast milk.  Contact a health care provider if you feel unusually sad or worried during the postpartum period.   Breastfeeding  Choosing to breastfeed is one of the best decisions you can make for yourself and your baby. A change in hormones during pregnancy causes your breasts to make breast milk in your milk-producing glands. Hormones prevent breast milk from being released before your baby is born. They also prompt milk flow after birth. Once breastfeeding has begun, thoughts of your baby, as well as his  or her sucking or crying, can stimulate the release of milk from your milk-producing glands. Benefits of breastfeeding Research shows that breastfeeding offers many health benefits for infants and mothers. It also offers a cost-free and convenient way to feed your baby. For your baby  Your first milk (colostrum) helps your baby's digestive system to function better.  Special cells in your milk (antibodies) help your baby to fight off infections.  Breastfed babies are less likely to develop asthma, allergies, obesity, or type 2 diabetes. They are also at lower risk for sudden infant death syndrome (SIDS).  Nutrients in breast milk are better able to meet your baby's needs compared to infant formula.  Breast milk improves your baby's brain development. For you  Breastfeeding helps to create a very special bond between you and your  baby.  Breastfeeding is convenient. Breast milk costs nothing and is always available at the correct temperature.  Breastfeeding helps to burn calories. It helps you to lose the weight that you gained during pregnancy.  Breastfeeding makes your uterus return faster to its size before pregnancy. It also slows bleeding (lochia) after you give birth.  Breastfeeding helps to lower your risk of developing type 2 diabetes, osteoporosis, rheumatoid arthritis, cardiovascular disease, and breast, ovarian, uterine, and endometrial cancer later in life. Breastfeeding basics Starting breastfeeding  Find a comfortable place to sit or lie down, with your neck and back well-supported.  Place a pillow or a rolled-up blanket under your baby to bring him or her to the level of your breast (if you are seated). Nursing pillows are specially designed to help support your arms and your baby while you breastfeed.  Make sure that your baby's tummy (abdomen) is facing your abdomen.  Gently massage your breast. With your fingertips, massage from the outer edges of your breast inward toward the nipple. This encourages milk flow. If your milk flows slowly, you may need to continue this action during the feeding.  Support your breast with 4 fingers underneath and your thumb above your nipple (make the letter "C" with your hand). Make sure your fingers are well away from your nipple and your baby's mouth.  Stroke your baby's lips gently with your finger or nipple.  When your baby's mouth is open wide enough, quickly bring your baby to your breast, placing your entire nipple and as much of the areola as possible into your baby's mouth. The areola is the colored area around your nipple. ? More areola should be visible above your baby's upper lip than below the lower lip. ? Your baby's lips should be opened and extended outward (flanged) to ensure an adequate, comfortable latch. ? Your baby's tongue should be between his  or her lower gum and your breast.  Make sure that your baby's mouth is correctly positioned around your nipple (latched). Your baby's lips should create a seal on your breast and be turned out (everted).  It is common for your baby to suck about 2-3 minutes in order to start the flow of breast milk. Latching Teaching your baby how to latch onto your breast properly is very important. An improper latch can cause nipple pain, decreased milk supply, and poor weight gain in your baby. Also, if your baby is not latched onto your nipple properly, he or she may swallow some air during feeding. This can make your baby fussy. Burping your baby when you switch breasts during the feeding can help to get rid of the air. However, teaching your  baby to latch on properly is still the best way to prevent fussiness from swallowing air while breastfeeding. Signs that your baby has successfully latched onto your nipple  Silent tugging or silent sucking, without causing you pain. Infant's lips should be extended outward (flanged).  Swallowing heard between every 3-4 sucks once your milk has started to flow (after your let-down milk reflex occurs).  Muscle movement above and in front of his or her ears while sucking. Signs that your baby has not successfully latched onto your nipple  Sucking sounds or smacking sounds from your baby while breastfeeding.  Nipple pain. If you think your baby has not latched on correctly, slip your finger into the corner of your baby's mouth to break the suction and place it between your baby's gums. Attempt to start breastfeeding again. Signs of successful breastfeeding Signs from your baby  Your baby will gradually decrease the number of sucks or will completely stop sucking.  Your baby will fall asleep.  Your baby's body will relax.  Your baby will retain a small amount of milk in his or her mouth.  Your baby will let go of your breast by himself or herself. Signs from  you  Breasts that have increased in firmness, weight, and size 1-3 hours after feeding.  Breasts that are softer immediately after breastfeeding.  Increased milk volume, as well as a change in milk consistency and color by the fifth day of breastfeeding.  Nipples that are not sore, cracked, or bleeding. Signs that your baby is getting enough milk  Wetting at least 1-2 diapers during the first 24 hours after birth.  Wetting at least 5-6 diapers every 24 hours for the first week after birth. The urine should be clear or pale yellow by the age of 5 days.  Wetting 6-8 diapers every 24 hours as your baby continues to grow and develop.  At least 3 stools in a 24-hour period by the age of 5 days. The stool should be soft and yellow.  At least 3 stools in a 24-hour period by the age of 7 days. The stool should be seedy and yellow.  No loss of weight greater than 10% of birth weight during the first 3 days of life.  Average weight gain of 4-7 oz (113-198 g) per week after the age of 4 days.  Consistent daily weight gain by the age of 5 days, without weight loss after the age of 2 weeks. After a feeding, your baby may spit up a small amount of milk. This is normal. Breastfeeding frequency and duration Frequent feeding will help you make more milk and can prevent sore nipples and extremely full breasts (breast engorgement). Breastfeed when you feel the need to reduce the fullness of your breasts or when your baby shows signs of hunger. This is called "breastfeeding on demand." Signs that your baby is hungry include:  Increased alertness, activity, or restlessness.  Movement of the head from side to side.  Opening of the mouth when the corner of the mouth or cheek is stroked (rooting).  Increased sucking sounds, smacking lips, cooing, sighing, or squeaking.  Hand-to-mouth movements and sucking on fingers or hands.  Fussing or crying. Avoid introducing a pacifier to your baby in the first  4-6 weeks after your baby is born. After this time, you may choose to use a pacifier. Research has shown that pacifier use during the first year of a baby's life decreases the risk of sudden infant death syndrome (SIDS).  Allow your baby to feed on each breast as long as he or she wants. When your baby unlatches or falls asleep while feeding from the first breast, offer the second breast. Because newborns are often sleepy in the first few weeks of life, you may need to awaken your baby to get him or her to feed. Breastfeeding times will vary from baby to baby. However, the following rules can serve as a guide to help you make sure that your baby is properly fed:  Newborns (babies 39 weeks of age or younger) may breastfeed every 1-3 hours.  Newborns should not go without breastfeeding for longer than 3 hours during the day or 5 hours during the night.  You should breastfeed your baby a minimum of 8 times in a 24-hour period. Breast milk pumping Pumping and storing breast milk allows you to make sure that your baby is exclusively fed your breast milk, even at times when you are unable to breastfeed. This is especially important if you go back to work while you are still breastfeeding, or if you are not able to be present during feedings. Your lactation consultant can help you find a method of pumping that works best for you and give you guidelines about how long it is safe to store breast milk.      Caring for your breasts while you breastfeed Nipples can become dry, cracked, and sore while breastfeeding. The following recommendations can help keep your breasts moisturized and healthy:  Avoid using soap on your nipples.  Wear a supportive bra designed especially for nursing. Avoid wearing underwire-style bras or extremely tight bras (sports bras).  Air-dry your nipples for 3-4 minutes after each feeding.  Use only cotton bra pads to absorb leaked breast milk. Leaking of breast milk between feedings  is normal.  Use lanolin on your nipples after breastfeeding. Lanolin helps to maintain your skin's normal moisture barrier. Pure lanolin is not harmful (not toxic) to your baby. You may also hand express a few drops of breast milk and gently massage that milk into your nipples and allow the milk to air-dry. In the first few weeks after giving birth, some women experience breast engorgement. Engorgement can make your breasts feel heavy, warm, and tender to the touch. Engorgement peaks within 3-5 days after you give birth. The following recommendations can help to ease engorgement:  Completely empty your breasts while breastfeeding or pumping. You may want to start by applying warm, moist heat (in the shower or with warm, water-soaked hand towels) just before feeding or pumping. This increases circulation and helps the milk flow. If your baby does not completely empty your breasts while breastfeeding, pump any extra milk after he or she is finished.  Apply ice packs to your breasts immediately after breastfeeding or pumping, unless this is too uncomfortable for you. To do this: ? Put ice in a plastic bag. ? Place a towel between your skin and the bag. ? Leave the ice on for 20 minutes, 2-3 times a day.  Make sure that your baby is latched on and positioned properly while breastfeeding. If engorgement persists after 48 hours of following these recommendations, contact your health care provider or a [employer]. Overall health care recommendations while breastfeeding  Eat 3 healthy meals and 3 snacks every day. Well-nourished mothers who are breastfeeding need an additional 450-500 calories a day. You can meet this requirement by increasing the amount of a balanced diet that you eat.  Drink enough water  to keep your urine pale yellow or clear.  Rest often, relax, and continue to take your prenatal vitamins to prevent fatigue, stress, and low vitamin and mineral levels in your body (nutrient  deficiencies).  Do not use any products that contain nicotine or tobacco, such as cigarettes and e-cigarettes. Your baby may be harmed by chemicals from cigarettes that pass into breast milk and exposure to secondhand smoke. If you need help quitting, ask your health care provider.  Avoid alcohol.  Do not use illegal drugs or marijuana.  Talk with your health care provider before taking any medicines. These include over-the-counter and prescription medicines as well as vitamins and herbal supplements. Some medicines that may be harmful to your baby can pass through breast milk.  It is possible to become pregnant while breastfeeding. If birth control is desired, ask your health care provider about options that will be safe while breastfeeding your baby. Where to find more information: [employer]: www.llli.org Contact a health care provider if:  You feel like you want to stop breastfeeding or have become frustrated with breastfeeding.  Your nipples are cracked or bleeding.  Your breasts are red, tender, or warm.  You have: ? Painful breasts or nipples. ? A swollen area on either breast. ? A fever or chills. ? Nausea or vomiting. ? Drainage other than breast milk from your nipples.  Your breasts do not become full before feedings by the fifth day after you give birth.  You feel sad and depressed.  Your baby is: ? Too sleepy to eat well. ? Having trouble sleeping. ? More than 38 week old and wetting fewer than 6 diapers in a 24-hour period. ? Not gaining weight by 46 days of age.  Your baby has fewer than 3 stools in a 24-hour period.  Your baby's skin or the white parts of his or her eyes become yellow. Get help right away if:  Your baby is overly tired (lethargic) and does not want to wake up and feed.  Your baby develops an unexplained fever. Summary  Breastfeeding offers many health benefits for infant and mothers.  Try to breastfeed your infant when  he or she shows early signs of hunger.  Gently tickle or stroke your baby's lips with your finger or nipple to allow the baby to open his or her mouth. Bring the baby to your breast. Make sure that much of the areola is in your baby's mouth. Offer one side and burp the baby before you offer the other side.  Talk with your health care provider or lactation consultant if you have questions or you face problems as you breastfeed. This information is not intended to replace advice given to you by your health care provider. Make sure you discuss any questions you have with your health care provider. Document Revised: 06/03/2017 Document Reviewed: 04/10/2016 Elsevier Patient Education  2021 [employer].

## 2020-08-26 NOTE — Anesthesia Postprocedure Evaluation (Signed)
Anesthesia Post Note  Patient: Carly Williams  Procedure(s) Performed: AN AD HOC LABOR EPIDURAL  Patient location during evaluation: Mother Baby Anesthesia Type: Epidural Level of consciousness: awake and alert Pain management: pain level controlled Vital Signs Assessment: post-procedure vital signs reviewed and stable Respiratory status: spontaneous breathing, nonlabored ventilation and respiratory function stable Cardiovascular status: stable Postop Assessment: no headache, no backache and epidural receding Anesthetic complications: no   No complications documented.   Last Vitals:  Vitals:   08/26/20 0414 08/26/20 0755  BP: 93/68 99/65  Pulse: 94 100  Resp: 18 18  Temp: 36.9 C 37.3 C  SpO2: 100% 99%    Last Pain:  Vitals:   08/26/20 0820  TempSrc:   PainSc: 0-No pain                 Rosemary Mossbarger Lawerance Cruel

## 2020-08-26 NOTE — Plan of Care (Signed)
D/C order from Dr. Logan Bores. Reviewed d/c instructions with patient and her mother and answered any questions. Patient given follow up appointment.

## 2020-08-28 ENCOUNTER — Telehealth: Payer: Self-pay | Admitting: Obstetrics and Gynecology

## 2020-08-28 NOTE — Telephone Encounter (Signed)
LM for patient to return call.

## 2020-08-28 NOTE — Telephone Encounter (Signed)
Patient called and stated that she is breast feeding and her nipples are very tender

## 2020-09-24 ENCOUNTER — Other Ambulatory Visit: Payer: Self-pay

## 2020-09-24 ENCOUNTER — Ambulatory Visit (INDEPENDENT_AMBULATORY_CARE_PROVIDER_SITE_OTHER): Payer: Managed Care, Other (non HMO) | Admitting: Obstetrics and Gynecology

## 2020-09-24 ENCOUNTER — Encounter: Payer: Self-pay | Admitting: Obstetrics and Gynecology

## 2020-09-24 DIAGNOSIS — Z3009 Encounter for other general counseling and advice on contraception: Secondary | ICD-10-CM
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
## 2020-10-02 ENCOUNTER — Other Ambulatory Visit: Payer: Self-pay

## 2020-10-02 ENCOUNTER — Encounter: Payer: Self-pay | Admitting: Obstetrics and Gynecology

## 2020-10-02 ENCOUNTER — Ambulatory Visit (INDEPENDENT_AMBULATORY_CARE_PROVIDER_SITE_OTHER): Payer: Managed Care, Other (non HMO) | Admitting: Obstetrics and Gynecology

## 2020-10-02 ENCOUNTER — Other Ambulatory Visit (HOSPITAL_COMMUNITY)
Admission: RE | Admit: 2020-10-02 | Discharge: 2020-10-02 | Disposition: A | Discharge status: Home / Self Care | Payer: Managed Care, Other (non HMO) | Source: Ambulatory Visit | Attending: Obstetrics and Gynecology | Admitting: Obstetrics and Gynecology

## 2020-10-02 VITALS — BP 116/77 | HR 71 | Ht 61.0 in | Wt 176.9 lb

## 2020-10-02 DIAGNOSIS — Z30430 Encounter for insertion of intrauterine contraceptive device: Secondary | ICD-10-CM | POA: Diagnosis not present

## 2020-10-02 DIAGNOSIS — Z202 Contact with and (suspected) exposure to infections with a predominantly sexual mode of transmission: Secondary | ICD-10-CM | POA: Insufficient documentation

## 2020-10-02 DIAGNOSIS — Z113 Encounter for screening for infections with a predominantly sexual mode of transmission: Secondary | ICD-10-CM | POA: Insufficient documentation
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
## 2020-10-03 LAB — CERVICOVAGINAL ANCILLARY ONLY
Bacterial Vaginitis (gardnerella): NEGATIVE
Candida Glabrata: NEGATIVE
Candida Vaginitis: NEGATIVE
Chlamydia: NEGATIVE
Comment: NEGATIVE
Comment: NEGATIVE
Comment: NEGATIVE
Comment: NEGATIVE
Comment: NEGATIVE
Comment: NORMAL
Neisseria Gonorrhea: NEGATIVE
Trichomonas: NEGATIVE

## 2020-10-30 ENCOUNTER — Ambulatory Visit (INDEPENDENT_AMBULATORY_CARE_PROVIDER_SITE_OTHER): Payer: Managed Care, Other (non HMO) | Admitting: Obstetrics and Gynecology

## 2020-10-30 ENCOUNTER — Other Ambulatory Visit: Payer: Self-pay

## 2020-10-30 ENCOUNTER — Encounter: Payer: Self-pay | Admitting: Obstetrics and Gynecology

## 2020-10-30 VITALS — BP 116/78 | HR 89 | Resp 16 | Ht 61.0 in | Wt 187.0 lb

## 2020-10-30 DIAGNOSIS — M5431 Sciatica, right side: Secondary | ICD-10-CM

## 2020-10-30 DIAGNOSIS — Z30431 Encounter for routine checking of intrauterine contraceptive device: Secondary | ICD-10-CM
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
## 2020-11-05 ENCOUNTER — Telehealth: Payer: Self-pay | Admitting: Obstetrics and Gynecology

## 2020-11-05 NOTE — Telephone Encounter (Signed)
Patient is requesting a note for work to not be lifting due to her back pain as previously discussed in her last visit.  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

## 2020-11-11 NOTE — Telephone Encounter (Signed)
Letter sent to [facility] along with message from Dr. Logan Bores.

## 2021-01-22 ENCOUNTER — Ambulatory Visit
Admission: EM | Admit: 2021-01-22 | Discharge: 2021-01-22 | Disposition: A | Discharge status: Home / Self Care | Payer: Managed Care, Other (non HMO) | Attending: Emergency Medicine | Admitting: Emergency Medicine

## 2021-01-22 ENCOUNTER — Other Ambulatory Visit: Payer: Self-pay

## 2021-01-22 DIAGNOSIS — B349 Viral infection, unspecified: Secondary | ICD-10-CM

## 2021-01-22 LAB — POCT INFLUENZA A/B
Influenza A, POC: NEGATIVE
Influenza B, POC: NEGATIVE
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
## 2021-01-22 NOTE — Discharge Instructions (Signed)

## 2021-01-22 NOTE — ED Triage Notes (Signed)
Patient presents to Urgent Care with complaints of body aches since today. Treating symptoms with tylenol.

## 2021-01-23 LAB — SARS-COV-2, NAA 2 DAY TAT

## 2021-01-23 LAB — NOVEL CORONAVIRUS, NAA: SARS-CoV-2, NAA: NOT DETECTED

## 2021-01-29 ENCOUNTER — Ambulatory Visit: Payer: Managed Care, Other (non HMO) | Admitting: Obstetrics and Gynecology

## 2021-01-30 ENCOUNTER — Ambulatory Visit (INDEPENDENT_AMBULATORY_CARE_PROVIDER_SITE_OTHER): Payer: Managed Care, Other (non HMO) | Admitting: Obstetrics and Gynecology

## 2021-01-30 ENCOUNTER — Encounter: Payer: Self-pay | Admitting: Obstetrics and Gynecology

## 2021-01-30 ENCOUNTER — Other Ambulatory Visit: Payer: Self-pay

## 2021-01-30 ENCOUNTER — Other Ambulatory Visit (HOSPITAL_COMMUNITY)
Admission: RE | Admit: 2021-01-30 | Discharge: 2021-01-30 | Disposition: A | Discharge status: Home / Self Care | Payer: Managed Care, Other (non HMO) | Source: Ambulatory Visit | Attending: Obstetrics and Gynecology | Admitting: Obstetrics and Gynecology

## 2021-01-30 VITALS — BP 115/79 | HR 81 | Ht 61.0 in | Wt 178.8 lb

## 2021-01-30 DIAGNOSIS — Z975 Presence of (intrauterine) contraceptive device: Secondary | ICD-10-CM | POA: Diagnosis not present

## 2021-01-30 DIAGNOSIS — N921 Excessive and frequent menstruation with irregular cycle: Secondary | ICD-10-CM | POA: Diagnosis not present

## 2021-01-30 DIAGNOSIS — Z113 Encounter for screening for infections with a predominantly sexual mode of transmission: Secondary | ICD-10-CM | POA: Diagnosis not present

## 2021-01-30 DIAGNOSIS — N949 Unspecified condition associated with female genital organs and menstrual cycle: Secondary | ICD-10-CM | POA: Insufficient documentation
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
## 2021-01-31 LAB — CERVICOVAGINAL ANCILLARY ONLY
Chlamydia: NEGATIVE
Comment: NEGATIVE
Comment: NEGATIVE
Comment: NORMAL
Neisseria Gonorrhea: NEGATIVE
Trichomonas: NEGATIVE

## 2021-02-06 ENCOUNTER — Emergency Department (HOSPITAL_BASED_OUTPATIENT_CLINIC_OR_DEPARTMENT_OTHER): Payer: Managed Care, Other (non HMO)

## 2021-02-06 ENCOUNTER — Encounter (HOSPITAL_BASED_OUTPATIENT_CLINIC_OR_DEPARTMENT_OTHER): Payer: Self-pay

## 2021-02-06 ENCOUNTER — Ambulatory Visit (INDEPENDENT_AMBULATORY_CARE_PROVIDER_SITE_OTHER)
Admission: EM | Admit: 2021-02-06 | Discharge: 2021-02-06 | Disposition: A | Discharge status: Other Acute Inpatient Hospital | Payer: Managed Care, Other (non HMO) | Source: Home / Self Care

## 2021-02-06 ENCOUNTER — Observation Stay (HOSPITAL_BASED_OUTPATIENT_CLINIC_OR_DEPARTMENT_OTHER)
Admission: EM | Admit: 2021-02-06 | Discharge: 2021-02-07 | Disposition: A | Discharge status: Home / Self Care | Payer: Managed Care, Other (non HMO) | Attending: Internal Medicine | Admitting: Internal Medicine

## 2021-02-06 ENCOUNTER — Encounter: Payer: Self-pay | Admitting: Emergency Medicine

## 2021-02-06 ENCOUNTER — Other Ambulatory Visit: Payer: Self-pay

## 2021-02-06 DIAGNOSIS — Z20822 Contact with and (suspected) exposure to covid-19: Secondary | ICD-10-CM | POA: Diagnosis not present

## 2021-02-06 DIAGNOSIS — R509 Fever, unspecified: Secondary | ICD-10-CM

## 2021-02-06 DIAGNOSIS — J038 Acute tonsillitis due to other specified organisms: Secondary | ICD-10-CM | POA: Diagnosis not present

## 2021-02-06 DIAGNOSIS — J029 Acute pharyngitis, unspecified: Secondary | ICD-10-CM

## 2021-02-06 DIAGNOSIS — R000 Tachycardia, unspecified: Secondary | ICD-10-CM

## 2021-02-06 DIAGNOSIS — B9689 Other specified bacterial agents as the cause of diseases classified elsewhere: Secondary | ICD-10-CM | POA: Insufficient documentation

## 2021-02-06 DIAGNOSIS — J45909 Unspecified asthma, uncomplicated: Secondary | ICD-10-CM | POA: Diagnosis not present

## 2021-02-06 DIAGNOSIS — J039 Acute tonsillitis, unspecified: Secondary | ICD-10-CM

## 2021-02-06 DIAGNOSIS — Z8616 Personal history of COVID-19: Secondary | ICD-10-CM | POA: Diagnosis not present

## 2021-02-06 DIAGNOSIS — Z79899 Other long term (current) drug therapy: Secondary | ICD-10-CM | POA: Insufficient documentation

## 2021-02-06 LAB — CBC WITH DIFFERENTIAL/PLATELET
Abs Immature Granulocytes: 0.08 10*3/uL — ABNORMAL HIGH (ref 0.00–0.07)
Basophils Absolute: 0 10*3/uL (ref 0.0–0.1)
Basophils Relative: 0 %
Eosinophils Absolute: 0 10*3/uL (ref 0.0–0.5)
Eosinophils Relative: 0 %
HCT: 39.5 % (ref 36.0–46.0)
Hemoglobin: 12.8 g/dL (ref 12.0–15.0)
Immature Granulocytes: 1 %
Lymphocytes Relative: 9 %
Lymphs Abs: 1.7 10*3/uL (ref 0.7–4.0)
MCH: 27.6 pg (ref 26.0–34.0)
MCHC: 32.4 g/dL (ref 30.0–36.0)
MCV: 85.3 fL (ref 80.0–100.0)
Monocytes Absolute: 1.3 10*3/uL — ABNORMAL HIGH (ref 0.1–1.0)
Monocytes Relative: 8 %
Neutro Abs: 14.4 10*3/uL — ABNORMAL HIGH (ref 1.7–7.7)
Neutrophils Relative %: 82 %
Platelets: 318 10*3/uL (ref 150–400)
RBC: 4.63 MIL/uL (ref 3.87–5.11)
RDW: 15 % (ref 11.5–15.5)
WBC: 17.5 10*3/uL — ABNORMAL HIGH (ref 4.0–10.5)
nRBC: 0 % (ref 0.0–0.2)

## 2021-02-06 LAB — COMPREHENSIVE METABOLIC PANEL
ALT: 13 U/L (ref 0–44)
AST: 17 U/L (ref 15–41)
Albumin: 4.3 g/dL (ref 3.5–5.0)
Alkaline Phosphatase: 68 U/L (ref 38–126)
Anion gap: 10 (ref 5–15)
BUN: 11 mg/dL (ref 6–20)
CO2: 24 mmol/L (ref 22–32)
Calcium: 9.4 mg/dL (ref 8.9–10.3)
Chloride: 100 mmol/L (ref 98–111)
Creatinine, Ser: 0.73 mg/dL (ref 0.44–1.00)
GFR, Estimated: >60 mL/min (ref >60)
Glucose, Bld: 83 mg/dL (ref 70–99)
Potassium: 3.8 mmol/L (ref 3.5–5.1)
Sodium: 134 mmol/L — ABNORMAL LOW (ref 135–145)
Total Bilirubin: 0.8 mg/dL (ref 0.3–1.2)
Total Protein: 8.1 g/dL (ref 6.5–8.1)

## 2021-02-06 LAB — RESP PANEL BY RT-PCR (FLU A&B, COVID) ARPGX2
Influenza A by PCR: NEGATIVE
Influenza B by PCR: NEGATIVE
SARS Coronavirus 2 by RT PCR: NEGATIVE

## 2021-02-06 LAB — MONONUCLEOSIS SCREEN: Mono Screen: NEGATIVE

## 2021-02-06 LAB — GROUP A STREP BY PCR: Group A Strep by PCR: NOT DETECTED

## 2021-02-06 LAB — HCG, QUANTITATIVE, PREGNANCY: hCG, Beta Chain, Quant, S: 1 m[IU]/mL (ref <5)

## 2021-02-06 MED ORDER — CLINDAMYCIN PHOSPHATE 600 MG/50ML IV SOLN
600.0000 mg | Freq: Once | INTRAVENOUS | Status: AC
  Administered 2021-02-06: 23:00:00 600 mg via INTRAVENOUS
  Filled 2021-02-06: qty 50

## 2021-02-06 MED ORDER — DEXAMETHASONE SODIUM PHOSPHATE 10 MG/ML IJ SOLN
8.0000 mg | Freq: Once | INTRAMUSCULAR | Status: AC
  Administered 2021-02-06: 23:00:00 8 mg via INTRAVENOUS
  Filled 2021-02-06: qty 1

## 2021-02-06 MED ORDER — SODIUM CHLORIDE 0.9 % IV SOLN: Freq: Once | INTRAVENOUS | Status: DC

## 2021-02-06 MED ORDER — KETOROLAC TROMETHAMINE 30 MG/ML IJ SOLN
30.0000 mg | Freq: Once | INTRAMUSCULAR | Status: AC
  Administered 2021-02-06: 23:00:00 30 mg via INTRAVENOUS
  Filled 2021-02-06: qty 1

## 2021-02-06 MED ORDER — IOHEXOL 300 MG/ML  SOLN
100.0000 mL | Freq: Once | INTRAMUSCULAR | Status: AC | PRN
  Administered 2021-02-06: 23:00:00 75 mL via INTRAVENOUS

## 2021-02-06 MED ORDER — LACTATED RINGERS IV BOLUS
1000.0000 mL | Freq: Once | INTRAVENOUS | Status: AC
  Administered 2021-02-06: 22:00:00 1000 mL via INTRAVENOUS

## 2021-02-06 MED ORDER — ACETAMINOPHEN 500 MG PO TABS
1000.0000 mg | ORAL_TABLET | Freq: Once | ORAL | Status: AC
  Administered 2021-02-06: 22:00:00 1000 mg via ORAL
  Filled 2021-02-06: qty 2

## 2021-02-06 NOTE — Discharge Instructions (Signed)
Your current condition warrants further evaluation and/or treatment which exceed services available to you in this urgent care setting. I have discussed with you your currrent condition and the need for further evaluation and/or treatment in an emergency department setting. In response to my medical recommendation, you have opted to go to the emergency department. 
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
## 2021-02-06 NOTE — ED Notes (Signed)
Pt educated on smoking cessation/vaping. Pt states she vapes regularly. RT educated pt on the adverse problems vaping can cause to the lungs and that it can lead to VILI (vape induced lung injury). Pt expresses understanding.

## 2021-02-06 NOTE — ED Triage Notes (Signed)
Sore throat, body aches, chills and fever.

## 2021-02-06 NOTE — ED Triage Notes (Signed)
Pt was seen 01/22/2021 and is not better. She c/o HA, bodyaches, and chills. The past 2 days she has had a sore throat.

## 2021-02-07 ENCOUNTER — Other Ambulatory Visit: Payer: Managed Care, Other (non HMO)

## 2021-02-07 DIAGNOSIS — B9689 Other specified bacterial agents as the cause of diseases classified elsewhere: Secondary | ICD-10-CM | POA: Diagnosis not present

## 2021-02-07 DIAGNOSIS — J039 Acute tonsillitis, unspecified: Secondary | ICD-10-CM

## 2021-02-07 DIAGNOSIS — J038 Acute tonsillitis due to other specified organisms: Secondary | ICD-10-CM

## 2021-02-07 LAB — CBC
HCT: 41.2 % (ref 36.0–46.0)
Hemoglobin: 13.5 g/dL (ref 12.0–15.0)
MCH: 28.4 pg (ref 26.0–34.0)
MCHC: 32.8 g/dL (ref 30.0–36.0)
MCV: 86.7 fL (ref 80.0–100.0)
Platelets: 295 10*3/uL (ref 150–400)
RBC: 4.75 MIL/uL (ref 3.87–5.11)
RDW: 15.1 % (ref 11.5–15.5)
WBC: 11.7 10*3/uL — ABNORMAL HIGH (ref 4.0–10.5)
nRBC: 0 % (ref 0.0–0.2)

## 2021-02-07 LAB — BASIC METABOLIC PANEL
Anion gap: 7 (ref 5–15)
BUN: 11 mg/dL (ref 6–20)
CO2: 23 mmol/L (ref 22–32)
Calcium: 9.3 mg/dL (ref 8.9–10.3)
Chloride: 107 mmol/L (ref 98–111)
Creatinine, Ser: 0.63 mg/dL (ref 0.44–1.00)
GFR, Estimated: >60 mL/min (ref >60)
Glucose, Bld: 122 mg/dL — ABNORMAL HIGH (ref 70–99)
Potassium: 4.3 mmol/L (ref 3.5–5.1)
Sodium: 137 mmol/L (ref 135–145)

## 2021-02-07 LAB — HIV ANTIBODY (ROUTINE TESTING W REFLEX): HIV Screen 4th Generation wRfx: NONREACTIVE

## 2021-02-07 MED ORDER — ONDANSETRON HCL 4 MG PO TABS: 4.0000 mg | ORAL_TABLET | Freq: Four times a day (QID) | ORAL | Status: DC | PRN

## 2021-02-07 MED ORDER — PREDNISONE 10 MG PO TABS: ORAL_TABLET | ORAL | 0 refills | Status: AC

## 2021-02-07 MED ORDER — MENTHOL 3 MG MT LOZG
1.0000 | LOZENGE | OROMUCOSAL | Status: DC | PRN
  Filled 2021-02-07: qty 9

## 2021-02-07 MED ORDER — ENOXAPARIN SODIUM 40 MG/0.4ML IJ SOSY
40.0000 mg | PREFILLED_SYRINGE | INTRAMUSCULAR | Status: DC
  Administered 2021-02-07: 40 mg via SUBCUTANEOUS
  Filled 2021-02-07: qty 0.4

## 2021-02-07 MED ORDER — ACETAMINOPHEN 650 MG RE SUPP: 650.0000 mg | Freq: Four times a day (QID) | RECTAL | Status: DC | PRN

## 2021-02-07 MED ORDER — CLINDAMYCIN HCL 300 MG PO CAPS: 300.0000 mg | ORAL_CAPSULE | Freq: Three times a day (TID) | ORAL | 0 refills | Status: AC

## 2021-02-07 MED ORDER — KETOROLAC TROMETHAMINE 30 MG/ML IJ SOLN: 30.0000 mg | Freq: Four times a day (QID) | INTRAMUSCULAR | Status: DC | PRN

## 2021-02-07 MED ORDER — MENTHOL 3 MG MT LOZG
1.0000 | LOZENGE | OROMUCOSAL | 12 refills | Status: DC | PRN
Start: 2021-02-07 — End: 2021-07-08

## 2021-02-07 MED ORDER — PHENOL 1.4 % MT LIQD: 1.0000 | OROMUCOSAL | 0 refills | Status: DC | PRN

## 2021-02-07 MED ORDER — CLINDAMYCIN PHOSPHATE 600 MG/50ML IV SOLN
600.0000 mg | Freq: Four times a day (QID) | INTRAVENOUS | Status: DC
  Administered 2021-02-07 (×2): 600 mg via INTRAVENOUS
  Filled 2021-02-07 (×2): qty 50

## 2021-02-07 MED ORDER — PREDNISONE 50 MG PO TABS
50.0000 mg | ORAL_TABLET | Freq: Every day | ORAL | Status: DC
  Administered 2021-02-07: 50 mg via ORAL
  Filled 2021-02-07: qty 1

## 2021-02-07 MED ORDER — ACETAMINOPHEN 325 MG PO TABS: 650.0000 mg | ORAL_TABLET | Freq: Four times a day (QID) | ORAL | Status: DC | PRN

## 2021-02-07 MED ORDER — PHENOL 1.4 % MT LIQD
1.0000 | OROMUCOSAL | Status: DC | PRN
  Administered 2021-02-07: 1 via OROMUCOSAL
  Filled 2021-02-07: qty 177

## 2021-02-07 MED ORDER — DEXAMETHASONE SODIUM PHOSPHATE 4 MG/ML IJ SOLN: 4.0000 mg | INTRAMUSCULAR | Status: DC

## 2021-02-07 MED ORDER — ONDANSETRON HCL 4 MG/2ML IJ SOLN: 4.0000 mg | Freq: Four times a day (QID) | INTRAMUSCULAR | Status: DC | PRN
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
## 2021-02-07 NOTE — ED Notes (Signed)
Called [facility] to transport patient to [facility] rm# [phone]

## 2021-02-07 NOTE — Discharge Summary (Signed)
Physician Discharge Summary  Carly Williams OEU:[id number] DOB: [birth year] DOA: 02/06/2021  PCP: Patient, No Pcp Per (Inactive)  Admit date: 02/06/2021 Discharge date: 02/07/2021  Admitted From: Home Disposition:  Home  Recommendations for Outpatient Follow-up:  Follow up with PCP in 1-2 weeks Please obtain BMP/CBC in one week  Home Health: None Equipment/Devices: None  Discharge Condition: Stable CODE STATUS: Full Diet recommendation: Liquid to soft diet, advance slowly as tolerated  Brief/Interim Summary: Carly Williams is a 20 y.o. female with no known medical history, [facility] with 3 to 4 days of sore throat found to have pharyngitis versus tonsillitis.  Imaging reassuringly negative for any obstructing tonsillar stones, no peritonsillar fluid collections or abscesses noted on imaging.  Patient now tolerating p.o. quite well, some ongoing pain with swallowing but otherwise able to take p.o. medications and liquids without any difficulty.  Continue clindamycin, steroid taper as discussed, follow-up with PCP in the next week as discussed.  If worsening symptoms, unable to take p.o. medications or p.o. liquids/soft diet patient should return back to the ED for further evaluation and treatment.  Discharge Instructions   Allergies as of 02/07/2021       Reactions   Metronidazole Nausea And Vomiting        Medication List     TAKE these medications    albuterol 108 (90 Base) MCG/ACT inhaler Commonly known as: VENTOLIN HFA Inhale 2 puffs into the lungs every 6 (six) hours as needed for wheezing or shortness of breath.   budesonide-formoterol 80-4.5 MCG/ACT inhaler Commonly known as: Symbicort Inhale 2 puffs into the lungs in the morning and at bedtime.   clindamycin 300 MG capsule Commonly known as: CLEOCIN Take 1 capsule (300 mg total) by mouth 3 (three) times daily for 10 days.   ibuprofen 600 MG  tablet Commonly known as: ADVIL Take 1 tablet (600 mg total) by mouth 4 (four) times daily.   levonorgestrel 20 MCG/DAY Iud Commonly known as: MIRENA 1 each by Intrauterine route once.   menthol-cetylpyridinium 3 MG lozenge Commonly known as: CEPACOL Take 1 lozenge (3 mg total) by mouth as needed for sore throat.   phenol 1.4 % Liqd Commonly known as: CHLORASEPTIC Use as directed 1 spray in the mouth or throat as needed for throat irritation / pain.   predniSONE 10 MG tablet Commonly known as: DELTASONE Take 4 tablets (40 mg total) by mouth daily for 3 days, THEN 3 tablets (30 mg total) daily for 3 days, THEN 2 tablets (20 mg total) daily for 3 days, THEN 1 tablet (10 mg total) daily for 3 days. Start taking on: February 07, 2021        Allergies  Allergen Reactions   Metronidazole Nausea And Vomiting    Consultations: None   Procedures/Studies: CT Soft Tissue Neck W Contrast  Result Date: 02/06/2021 CLINICAL DATA:  Sore throat, body aches, chills and fever for 2 weeks EXAM: CT NECK WITH CONTRAST TECHNIQUE: Multidetector CT imaging of the neck was performed using the standard protocol following the bolus administration of intravenous contrast. CONTRAST:  57mL OMNIPAQUE IOHEXOL 300 MG/ML  SOLN COMPARISON:  None. FINDINGS: PHARYNX AND LARYNX: Palatine tonsils are markedly enlarged. No peritonsillar or retropharyngeal fluid collection. Adenoid tonsils are also mildly enlarged. Epiglottis and larynx are normal. SALIVARY GLANDS: Normal parotid, submandibular and sublingual glands. THYROID: Normal. LYMPH NODES: No enlarged or abnormal density lymph nodes. VASCULAR: Bilateral reactive cervical lymphadenopathy measuring up to  17 mm. LIMITED INTRACRANIAL: Normal. VISUALIZED ORBITS: Normal. MASTOIDS AND VISUALIZED PARANASAL SINUSES: No fluid levels or advanced mucosal thickening. No mastoid effusion. SKELETON: No bony spinal canal stenosis. No lytic or blastic lesions. UPPER CHEST: Clear.  OTHER: None. IMPRESSION: 1. Acute tonsillopharyngitis without peritonsillar or retropharyngeal fluid collection. 2. Reactive cervical lymphadenopathy. Electronically Signed   By: Deatra Robinson M.D.   On: 02/06/2021 23:24     Subjective: No acute issues or events overnight tolerating p.o. quite well this morning denies nausea vomiting diarrhea constipation headache fevers chills or chest pain   Discharge Exam: Vitals:   02/07/21 0455 02/07/21 0953  BP: 106/69 (!) 94/57  Pulse: 64 61  Resp: 14 16  Temp: (!) 97.4 F (36.3 C) 98.2 F (36.8 C)  SpO2: 97% 99%   Vitals:   02/07/21 0055 02/07/21 0220 02/07/21 0455 02/07/21 0953  BP:  108/65 106/69 (!) 94/57  Pulse:  72 64 61  Resp:  Temp:  98.1 F (36.7 C) (!) 97.4 F (36.3 C) 98.2 F (36.8 C)  TempSrc:  Oral Oral Oral  SpO2: 100% 97% 97% 99%  Weight:      Height:        General: Pt is alert, awake, not in acute distress HEENT: Erythematous peritonsillar area without overt exudate, without stridor wheeze or "hot potato voice" Cardiovascular: RRR, S1/S2 +, no rubs, no gallops Respiratory: CTA bilaterally, no wheezing, no rhonchi Abdominal: Soft, NT, ND, bowel sounds + Extremities: no edema, no cyanosis   The results of significant diagnostics from this hospitalization (including imaging, microbiology, ancillary and laboratory) are listed below for reference.     Microbiology: Recent Results (from the past 240 hour(s))  Culture, group A strep     Status: None (Preliminary result)   Collection Time: 02/06/21  6:44 PM   Specimen: Throat  Result Value Ref Range Status   Specimen Description THROAT  Final   Special Requests NONE  Final   Culture   Final    TOO YOUNG TO READ Performed at [facility], [address], [place], [place] [address]    Report Status PENDING  Incomplete  Resp Panel by RT-PCR (Flu A&B, Covid) Nasopharyngeal Swab     Status: None   Collection Time: 02/06/21  7:25 PM   Specimen:  Nasopharyngeal Swab; Nasopharyngeal(NP) swabs in vial transport medium  Result Value Ref Range Status   SARS Coronavirus 2 by RT PCR NEGATIVE NEGATIVE Final    Comment: (NOTE) SARS-CoV-2 target nucleic acids are NOT DETECTED.  The SARS-CoV-2 RNA is generally detectable in upper respiratory specimens during the acute phase of infection. The lowest concentration of SARS-CoV-2 viral copies this assay can detect is 138 copies/mL. A negative result does not preclude SARS-Cov-2 infection and should not be used as the sole basis for treatment or other patient management decisions. A negative result may occur with  improper specimen collection/handling, submission of specimen other than nasopharyngeal swab, presence of viral mutation(s) within the areas targeted by this assay, and inadequate number of viral copies(<138 copies/mL). A negative result must be combined with clinical observations, patient history, and epidemiological information. The expected result is Negative.  Fact Sheet for Patients:  BloggerCourse.com  Fact Sheet for Healthcare Providers:  SeriousBroker.it  This test is no t yet approved or cleared by the [place] FDA and  has been authorized for detection and/or diagnosis of SARS-CoV-2 by FDA under an Emergency Use Authorization (EUA). This EUA will remain  in effect (meaning this test can be used) for the duration of the COVID-19 declaration under Section 564(b)(1) of the Act, 21 U.S.C.section 360bbb-3(b)(1), unless the authorization is terminated  or revoked sooner.       Influenza A by PCR NEGATIVE NEGATIVE Final   Influenza B by PCR NEGATIVE NEGATIVE Final    Comment: (NOTE) The Xpert Xpress SARS-CoV-2/FLU/RSV plus assay is intended as an aid in the diagnosis of influenza from Nasopharyngeal swab specimens and should not be used as a sole basis for treatment. Nasal washings and aspirates are unacceptable for  Xpert Xpress SARS-CoV-2/FLU/RSV testing.  Fact Sheet for Patients: BloggerCourse.com  Fact Sheet for Healthcare Providers: SeriousBroker.it  This test is not yet approved or cleared by the [place] FDA and has been authorized for detection and/or diagnosis of SARS-CoV-2 by FDA under an Emergency Use Authorization (EUA). This EUA will remain in effect (meaning this test can be used) for the duration of the COVID-19 declaration under Section 564(b)(1) of the Act, 21 U.S.C. section 360bbb-3(b)(1), unless the authorization is terminated or revoked.  Performed at [facility], [address], [place], [place] [address]   Group A Strep by PCR     Status: None   Collection Time: 02/06/21  8:50 PM   Specimen: Throat; Sterile Swab  Result Value Ref Range Status   Group A Strep by PCR NOT DETECTED NOT DETECTED Final    Comment: Performed at [facility], [address], [place], [place] [address]     Labs: BNP (last 3 results) No results for input(s): BNP in the last 8760 hours. Basic Metabolic Panel: Recent Labs  Lab 02/06/21 2152 02/07/21 0548  NA 134* 137  K 3.8 4.3  CL 100 107  CO2 24 23  GLUCOSE 83 122*  BUN 11 11  CREATININE 0.73 0.63  CALCIUM 9.4 9.3   Liver Function Tests: Recent Labs  Lab 02/06/21 2152  AST 17  ALT 13  ALKPHOS 68  BILITOT 0.8  PROT 8.1  ALBUMIN 4.3   No results for input(s): LIPASE, AMYLASE in the last 168 hours. No results for input(s): AMMONIA in the last 168 hours. CBC: Recent Labs  Lab 02/06/21 2152 02/07/21 0548  WBC 17.5* 11.7*  NEUTROABS 14.4*  --   HGB 12.8 13.5  HCT 39.5 41.2  MCV 85.3 86.7  PLT 318 295   Cardiac Enzymes: No results for input(s): CKTOTAL, CKMB, CKMBINDEX, TROPONINI in the last 168 hours. BNP: Invalid input(s): POCBNP CBG: No results for input(s): GLUCAP in the last 168 hours. D-Dimer No results for  input(s): DDIMER in the last 72 hours. Hgb A1c No results for input(s): HGBA1C in the last 72 hours. Lipid Profile No results for input(s): CHOL, HDL, LDLCALC, TRIG, CHOLHDL, LDLDIRECT in the last 72 hours. Thyroid function studies No results for input(s): TSH, T4TOTAL, T3FREE, THYROIDAB in the last 72 hours.  Invalid input(s): FREET3 Anemia work up No results for input(s): VITAMINB12, FOLATE, FERRITIN, TIBC, IRON, RETICCTPCT in the last 72 hours. Urinalysis    Component Value Date/Time   COLORURINE AMBER (A) 03/19/2020 1108   APPEARANCEUR CLOUDY (A) 03/19/2020 1108   LABSPEC 1.029 03/19/2020 1108   PHURINE 5.0 03/19/2020 1108   GLUCOSEU Negative 08/22/2020 1613   GLUCOSEU NEGATIVE 03/19/2020 1108   HGBUR NEGATIVE 03/19/2020 1108   BILIRUBINUR neg 08/22/2020 1613   KETONESUR 80 (A) 03/19/2020 1108   PROTEINUR 100 (A) 03/19/2020 1108   UROBILINOGEN 0.2 08/22/2020 1613   NITRITE neg 08/22/2020  1613   NITRITE NEGATIVE 03/19/2020 1108   LEUKOCYTESUR Negative 08/22/2020 1613   LEUKOCYTESUR MODERATE (A) 03/19/2020 1108   Sepsis Labs Invalid input(s): PROCALCITONIN,  WBC,  LACTICIDVEN Microbiology Recent Results (from the past 240 hour(s))  Culture, group A strep     Status: None (Preliminary result)   Collection Time: 02/06/21  6:44 PM   Specimen: Throat  Result Value Ref Range Status   Specimen Description THROAT  Final   Special Requests NONE  Final   Culture   Final    TOO YOUNG TO READ Performed at [facility], [address] [place], [place] [address]    Report Status PENDING  Incomplete  Resp Panel by RT-PCR (Flu A&B, Covid) Nasopharyngeal Swab     Status: None   Collection Time: 02/06/21  7:25 PM   Specimen: Nasopharyngeal Swab; Nasopharyngeal(NP) swabs in vial transport medium  Result Value Ref Range Status   SARS Coronavirus 2 by RT PCR NEGATIVE NEGATIVE Final    Comment: (NOTE) SARS-CoV-2 target nucleic acids are NOT DETECTED.  The SARS-CoV-2 RNA is  generally detectable in upper respiratory specimens during the acute phase of infection. The lowest concentration of SARS-CoV-2 viral copies this assay can detect is 138 copies/mL. A negative result does not preclude SARS-Cov-2 infection and should not be used as the sole basis for treatment or other patient management decisions. A negative result may occur with  improper specimen collection/handling, submission of specimen other than nasopharyngeal swab, presence of viral mutation(s) within the areas targeted by this assay, and inadequate number of viral copies(<138 copies/mL). A negative result must be combined with clinical observations, patient history, and epidemiological information. The expected result is Negative.  Fact Sheet for Patients:  BloggerCourse.com  Fact Sheet for Healthcare Providers:  SeriousBroker.it  This test is no t yet approved or cleared by the [place] FDA and  has been authorized for detection and/or diagnosis of SARS-CoV-2 by FDA under an Emergency Use Authorization (EUA). This EUA will remain  in effect (meaning this test can be used) for the duration of the COVID-19 declaration under Section 564(b)(1) of the Act, 21 U.S.C.section 360bbb-3(b)(1), unless the authorization is terminated  or revoked sooner.       Influenza A by PCR NEGATIVE NEGATIVE Final   Influenza B by PCR NEGATIVE NEGATIVE Final    Comment: (NOTE) The Xpert Xpress SARS-CoV-2/FLU/RSV plus assay is intended as an aid in the diagnosis of influenza from Nasopharyngeal swab specimens and should not be used as a sole basis for treatment. Nasal washings and aspirates are unacceptable for Xpert Xpress SARS-CoV-2/FLU/RSV testing.  Fact Sheet for Patients: BloggerCourse.com  Fact Sheet for Healthcare Providers: SeriousBroker.it  This test is not yet approved or cleared by the [place] FDA and has been authorized for detection and/or diagnosis of SARS-CoV-2 by FDA under an Emergency Use Authorization (EUA). This EUA will remain in effect (meaning this test can be used) for the duration of the COVID-19 declaration under Section 564(b)(1) of the Act, 21 U.S.C. section 360bbb-3(b)(1), unless the authorization is terminated or revoked.  Performed at [facility], [address] [place], [place] [address]   Group A Strep by PCR     Status: None   Collection Time: 02/06/21  8:50 PM   Specimen: Throat; Sterile Swab  Result Value Ref Range Status   Group A Strep by PCR NOT DETECTED NOT DETECTED Final    Comment: Performed at [facility], [address] [place], [place] [address]     Time coordinating discharge: Over 30 minutes  SIGNED:   Azucena Fallen, DO [facility] 02/07/2021, 6:01 PM Pager   If 7PM-7AM, please contact night-coverage [email or web address]

## 2021-02-09 LAB — CULTURE, GROUP A STREP (THRC)

## 2021-02-12 ENCOUNTER — Other Ambulatory Visit: Payer: Self-pay | Admitting: Obstetrics and Gynecology

## 2021-02-12 ENCOUNTER — Other Ambulatory Visit: Payer: Self-pay

## 2021-02-12 ENCOUNTER — Ambulatory Visit (INDEPENDENT_AMBULATORY_CARE_PROVIDER_SITE_OTHER): Payer: Managed Care, Other (non HMO)

## 2021-02-12 DIAGNOSIS — Z975 Presence of (intrauterine) contraceptive device: Secondary | ICD-10-CM | POA: Diagnosis not present

## 2021-02-12 DIAGNOSIS — N921 Excessive and frequent menstruation with irregular cycle: Secondary | ICD-10-CM

## 2021-07-08 ENCOUNTER — Encounter: Payer: Self-pay | Admitting: Obstetrics and Gynecology

## 2021-07-08 ENCOUNTER — Other Ambulatory Visit (HOSPITAL_COMMUNITY)
Admission: RE | Admit: 2021-07-08 | Discharge: 2021-07-08 | Disposition: A | Discharge status: Home / Self Care | Payer: Managed Care, Other (non HMO) | Source: Ambulatory Visit | Attending: Obstetrics and Gynecology | Admitting: Obstetrics and Gynecology

## 2021-07-08 ENCOUNTER — Ambulatory Visit (INDEPENDENT_AMBULATORY_CARE_PROVIDER_SITE_OTHER): Payer: Managed Care, Other (non HMO) | Admitting: Obstetrics and Gynecology

## 2021-07-08 VITALS — BP 110/78 | HR 79 | Ht 61.0 in | Wt 168.4 lb

## 2021-07-08 DIAGNOSIS — Z01419 Encounter for gynecological examination (general) (routine) without abnormal findings: Secondary | ICD-10-CM

## 2021-07-08 DIAGNOSIS — Z113 Encounter for screening for infections with a predominantly sexual mode of transmission: Secondary | ICD-10-CM

## 2021-07-08 DIAGNOSIS — Z124 Encounter for screening for malignant neoplasm of cervix: Secondary | ICD-10-CM | POA: Insufficient documentation

## 2021-07-08 NOTE — Progress Notes (Signed)
Patient presents today for pap smear and a "full work up" for std screening. She states over the weekend she had some back pain and saw/felt her IUD strings and would like them to be checked as well. Patient states no other questions or concerns at this time.  ?
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
## 2021-07-09 LAB — RPR: RPR Ser Ql: NONREACTIVE

## 2021-07-09 LAB — CERVICOVAGINAL ANCILLARY ONLY
Bacterial Vaginitis (gardnerella): POSITIVE — AB
Candida Glabrata: NEGATIVE
Candida Vaginitis: NEGATIVE
Chlamydia: NEGATIVE
Comment: NEGATIVE
Comment: NEGATIVE
Comment: NEGATIVE
Comment: NEGATIVE
Comment: NEGATIVE
Comment: NORMAL
Neisseria Gonorrhea: NEGATIVE
Trichomonas: NEGATIVE

## 2021-07-09 LAB — HEPB+HEPC+HIV PANEL
HIV Screen 4th Generation wRfx: NONREACTIVE
Hep B C IgM: NEGATIVE
Hep B Core Total Ab: NEGATIVE
Hep B E Ab: NEGATIVE
Hep B E Ag: NEGATIVE
Hep B Surface Ab, Qual: NONREACTIVE
Hep C Virus Ab: NONREACTIVE
Hepatitis B Surface Ag: NEGATIVE

## 2021-07-10 ENCOUNTER — Other Ambulatory Visit: Payer: Self-pay

## 2021-07-10 DIAGNOSIS — N760 Acute vaginitis: Secondary | ICD-10-CM

## 2021-07-10 LAB — CYTOLOGY - PAP
Diagnosis: NEGATIVE
Diagnosis: REACTIVE

## 2021-07-10 MED ORDER — CLINDAMYCIN HCL 300 MG PO CAPS: 300.0000 mg | ORAL_CAPSULE | Freq: Two times a day (BID) | ORAL | 0 refills | Status: DC

## 2021-07-10 NOTE — Progress Notes (Signed)
Taylor: ?Her results are c/w BV - Needs Rx for: ?Flagyl 500mg - 1 PO BID X7 days

## 2021-07-10 NOTE — Progress Notes (Signed)
Clindamycin called in due to patients previous allergy per Dr.Evans ?

## 2021-07-15 ENCOUNTER — Encounter: Payer: Self-pay | Admitting: Emergency Medicine

## 2021-07-15 ENCOUNTER — Ambulatory Visit
Admission: EM | Admit: 2021-07-15 | Discharge: 2021-07-15 | Disposition: A | Discharge status: Home / Self Care | Payer: Managed Care, Other (non HMO) | Attending: Emergency Medicine | Admitting: Emergency Medicine

## 2021-07-15 DIAGNOSIS — J020 Streptococcal pharyngitis: Secondary | ICD-10-CM

## 2021-07-15 LAB — POCT RAPID STREP A (OFFICE): Rapid Strep A Screen: NEGATIVE

## 2021-07-15 MED ORDER — ONDANSETRON 4 MG PO TBDP: 4.0000 mg | ORAL_TABLET | Freq: Three times a day (TID) | ORAL | 0 refills | Status: DC | PRN

## 2021-07-15 MED ORDER — AMOXICILLIN-POT CLAVULANATE 875-125 MG PO TABS: 1.0000 | ORAL_TABLET | Freq: Two times a day (BID) | ORAL | 0 refills | Status: AC

## 2021-07-15 NOTE — ED Triage Notes (Signed)
Pt presents with HA, ST, bodyaches, chills since yesterday. Pt also had some vomiting yesterday.  ?

## 2021-07-15 NOTE — Discharge Instructions (Addendum)
AndYour strep test was negative and has been sent for culture however I feel that your test results have been skewed based on your current use of clindamycin we will move forward with additional treatment for strep based on your examination as your throat is reddened your tonsils are swollen and there are Bhargav Barbaro patches along with a fever and vomiting ? ?Begin use of Augmentin twice daily for 10 days, you may finish your course of clindamycin we will treat for your vaginal infection, you should begin to see improvement in your symptoms in about 48 hours as well as this reduces your risk of transmission ? ?Zofran every 8 hours to help minimize nausea and vomiting, place tablet under your tongue and allow it to dissolve, wait 30 minutes to an hour before attempting to eat or drink ? ?You should take Tylenol or ibuprofen every 6 hours consistently for the next few days, this will help minimize fevers, body aches and headache ? ?Until you are able to tolerate foods, increase your fluid intake to prevent dehydration ? ?For your throat you may also attempt salt water gargles, warm liquids, soft foods, teaspoons of honey for additional support ? ?You may follow-up with urgent care as needed for persisting or worsening symptoms ?
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
## 2021-07-15 NOTE — Progress Notes (Signed)
done

## 2021-07-18 ENCOUNTER — Ambulatory Visit
Admission: EM | Admit: 2021-07-18 | Discharge: 2021-07-18 | Disposition: A | Discharge status: Home / Self Care | Payer: Managed Care, Other (non HMO)

## 2021-07-18 ENCOUNTER — Encounter: Payer: Self-pay | Admitting: Emergency Medicine

## 2021-07-18 DIAGNOSIS — L089 Local infection of the skin and subcutaneous tissue, unspecified: Secondary | ICD-10-CM

## 2021-07-18 DIAGNOSIS — S60811A Abrasion of right wrist, initial encounter: Secondary | ICD-10-CM | POA: Diagnosis not present

## 2021-07-18 NOTE — ED Triage Notes (Signed)
Pt here with rash to right wrist from watch for over a month. She also c/o hand and feet swelling and what feels like electrical impulses in her legs when she walks.  ?
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
## 2021-07-21 ENCOUNTER — Other Ambulatory Visit: Payer: Self-pay

## 2021-07-21 ENCOUNTER — Emergency Department
Admission: EM | Admit: 2021-07-21 | Discharge: 2021-07-21 | Disposition: A | Discharge status: Home / Self Care | Payer: Managed Care, Other (non HMO) | Attending: Emergency Medicine | Admitting: Emergency Medicine

## 2021-07-21 DIAGNOSIS — R112 Nausea with vomiting, unspecified: Secondary | ICD-10-CM

## 2021-07-21 DIAGNOSIS — D72829 Elevated white blood cell count, unspecified: Secondary | ICD-10-CM | POA: Insufficient documentation

## 2021-07-21 DIAGNOSIS — K529 Noninfective gastroenteritis and colitis, unspecified: Secondary | ICD-10-CM | POA: Diagnosis not present

## 2021-07-21 LAB — URINALYSIS, ROUTINE W REFLEX MICROSCOPIC
Bacteria, UA: NONE SEEN
Bilirubin Urine: NEGATIVE
Glucose, UA: NEGATIVE mg/dL
Ketones, ur: NEGATIVE mg/dL
Nitrite: NEGATIVE
Protein, ur: NEGATIVE mg/dL
Specific Gravity, Urine: 1.031 — ABNORMAL HIGH (ref 1.005–1.030)
pH: 5 (ref 5.0–8.0)

## 2021-07-21 LAB — CBC
HCT: 46.1 % — ABNORMAL HIGH (ref 36.0–46.0)
Hemoglobin: 14.6 g/dL (ref 12.0–15.0)
MCH: 27.7 pg (ref 26.0–34.0)
MCHC: 31.7 g/dL (ref 30.0–36.0)
MCV: 87.5 fL (ref 80.0–100.0)
Platelets: 300 10*3/uL (ref 150–400)
RBC: 5.27 MIL/uL — ABNORMAL HIGH (ref 3.87–5.11)
RDW: 13.6 % (ref 11.5–15.5)
WBC: 13.4 10*3/uL — ABNORMAL HIGH (ref 4.0–10.5)
nRBC: 0 % (ref 0.0–0.2)

## 2021-07-21 LAB — COMPREHENSIVE METABOLIC PANEL
ALT: 18 U/L (ref 0–44)
AST: 19 U/L (ref 15–41)
Albumin: 4.3 g/dL (ref 3.5–5.0)
Alkaline Phosphatase: 59 U/L (ref 38–126)
Anion gap: 5 (ref 5–15)
BUN: 12 mg/dL (ref 6–20)
CO2: 27 mmol/L (ref 22–32)
Calcium: 9 mg/dL (ref 8.9–10.3)
Chloride: 107 mmol/L (ref 98–111)
Creatinine, Ser: 0.66 mg/dL (ref 0.44–1.00)
GFR, Estimated: >60 mL/min (ref >60)
Glucose, Bld: 120 mg/dL — ABNORMAL HIGH (ref 70–99)
Potassium: 3.9 mmol/L (ref 3.5–5.1)
Sodium: 139 mmol/L (ref 135–145)
Total Bilirubin: 0.9 mg/dL (ref 0.3–1.2)
Total Protein: 8.4 g/dL — ABNORMAL HIGH (ref 6.5–8.1)

## 2021-07-21 LAB — LIPASE, BLOOD: Lipase: 29 U/L (ref 11–51)

## 2021-07-21 LAB — POC URINE PREG, ED: Preg Test, Ur: NEGATIVE

## 2021-07-21 MED ORDER — ONDANSETRON HCL 4 MG/2ML IJ SOLN
4.0000 mg | Freq: Once | INTRAMUSCULAR | Status: AC
  Administered 2021-07-21: 4 mg via INTRAVENOUS
  Filled 2021-07-21: qty 2

## 2021-07-21 MED ORDER — ONDANSETRON 4 MG PO TBDP: 4.0000 mg | ORAL_TABLET | Freq: Three times a day (TID) | ORAL | 0 refills | Status: DC | PRN

## 2021-07-21 MED ORDER — SODIUM CHLORIDE 0.9 % IV BOLUS
1000.0000 mL | Freq: Once | INTRAVENOUS | Status: AC
Start: 2021-07-21 — End: 2021-07-21
  Administered 2021-07-21: 1000 mL via INTRAVENOUS

## 2021-07-21 MED ORDER — KETOROLAC TROMETHAMINE 30 MG/ML IJ SOLN
15.0000 mg | Freq: Once | INTRAMUSCULAR | Status: AC
  Administered 2021-07-21: 15 mg via INTRAVENOUS
  Filled 2021-07-21: qty 1

## 2021-07-21 MED ORDER — SODIUM CHLORIDE 0.9 % IV BOLUS
1000.0000 mL | Freq: Once | INTRAVENOUS | Status: AC
  Administered 2021-07-21: 1000 mL via INTRAVENOUS

## 2021-07-21 MED ORDER — METOCLOPRAMIDE HCL 5 MG/ML IJ SOLN
10.0000 mg | Freq: Once | INTRAMUSCULAR | Status: AC
Start: 2021-07-21 — End: 2021-07-21
  Administered 2021-07-21: 10 mg via INTRAVENOUS
  Filled 2021-07-21: qty 2

## 2021-07-21 NOTE — ED Triage Notes (Signed)
Pt c/o N/VD since midnight last night, thinks she ate some thing bad ?

## 2021-07-21 NOTE — ED Provider Notes (Signed)
. ? ?[facility] ?Provider Note ? ? ? Event Date/Time  ? First MD Initiated Contact with Patient 07/21/21 1118   ?  (approximate) ? ?History  ? ?Chief Complaint: Emesis ? ?HPI ? ?Ursula Alert Adelyne Marchese is a 21 y.o. female with no significant past medical history presents to the emergency department for nausea vomiting diarrhea.  According to the patient since 10 PM last night she has been nauseated with frequent episodes of vomiting and diarrhea.  Patient states she has not been able to keep down any fluids since last night vomiting every 20 to 30 minutes per patient.  Patient denies any fever.  States abdominal cramping at times but denies any "pain."  Patient denies any known sick contacts.  No dysuria.  Currently on her menstrual cycle. ? ?Physical Exam  ? ?Triage Vital Signs: ?ED Triage Vitals  ?Enc Vitals Group  ?   BP 07/21/21 1055 117/78  ?   Pulse Rate 07/21/21 1055 99  ?   Resp 07/21/21 1055 17  ?   Temp 07/21/21 1055 99 ?F (37.2 ?C)  ?   Temp Source 07/21/21 1055 Oral  ?   SpO2 07/21/21 1055 98 %  ?   Weight 07/21/21 1116 168 lb 3.4 oz (76.3 kg)  ?   Height 07/21/21 1056 5\' 1"  (1.549 m)  ?   Head Circumference --   ?   Peak Flow --   ?   Pain Score 07/21/21 1056 2  ?   Pain Loc --   ?   Pain Edu? --   ?   Excl. in GC? --   ? ? ?Most recent vital signs: ?Vitals:  ? 07/21/21 1055  ?BP: 117/78  ?Pulse: 99  ?Resp: 17  ?Temp: 99 ?F (37.2 ?C)  ?SpO2: 98%  ? ? ?General: Awake, no distress. ?CV:  Good peripheral perfusion.  Regular rate and rhythm  ?Resp:  Normal effort.  Equal breath sounds bilaterally.  ?Abd:  No distention.  Soft, nontender.  No rebound or guarding.  Benign abdominal exam ? ? ? ?ED Results / Procedures / Treatments  ? ? ?MEDICATIONS ORDERED IN ED: ?Medications  ?sodium chloride 0.9 % bolus 1,000 mL (has no administration in time range)  ?ondansetron (ZOFRAN) injection 4 mg (has no administration in time range)  ?ketorolac (TORADOL) 30 MG/ML injection 15 mg (has no  administration in time range)  ? ? ? ?IMPRESSION / MDM / ASSESSMENT AND PLAN / ED COURSE  ?I reviewed the triage vital signs and the nursing notes. ? ?Patient presents emergency department for nausea vomiting and diarrhea since 10 PM last night.  Patient symptoms are very suggestive of gastroenteritis, benign abdominal exam.  Patient's lab work shows a mild leukocytosis of 13,000 otherwise reassuring CBC, reassuring chemistry including renal function and a normal anion gap.  Urinalysis does not show any concerning findings.  Pregnancy test negative.  Lipase negative.  We will treat the patient with Zofran, Toradol, IV fluids and reassess.  Patient agreeable to plan of care. ? ?Patient continues to have mild nausea although states she is feeling better than she did when she arrived.  Patient's work-up shows mild leukocytosis otherwise reassuring.  We will dose additional liter of fluid as well as IV Reglan.  We will take the patient up to go with Zofran, highly suspect gastroenteritis to be the cause of the patient's symptoms.  Discussed my typical gastroenteritis return precautions. ? ?FINAL CLINICAL IMPRESSION(S) / ED DIAGNOSES  ? ?  Gastroenteritis ? ?Rx / DC Orders  ? ?Zofran ? ?Note:  This document was prepared using Dragon voice recognition software and may include unintentional dictation errors. ?  ?Minna Antis, MD ?07/21/21 1354 ? ?

## 2021-07-29 ENCOUNTER — Ambulatory Visit
Admission: EM | Admit: 2021-07-29 | Discharge: 2021-07-29 | Disposition: A | Discharge status: Home / Self Care | Payer: Managed Care, Other (non HMO) | Attending: Student | Admitting: Student

## 2021-07-29 DIAGNOSIS — J020 Streptococcal pharyngitis: Secondary | ICD-10-CM

## 2021-07-29 LAB — POCT RAPID STREP A (OFFICE): Rapid Strep A Screen: POSITIVE — AB

## 2021-07-29 MED ORDER — AMOXICILLIN 500 MG PO CAPS: 500.0000 mg | ORAL_CAPSULE | Freq: Two times a day (BID) | ORAL | 0 refills | Status: AC

## 2021-07-29 NOTE — ED Triage Notes (Signed)
Patient presents to Urgent Care for strep testing. She developed a sore throat today. She states she was treated for strep 04/25. She states she did not full course of antibiotics due to food poisoning.  ?
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
## 2021-07-29 NOTE — Discharge Instructions (Addendum)
-  Start the antibiotic-Amoxicillin, 1 pill every 12 hours for 10 days.  You can take this with food like with breakfast and dinner. ?-You can continue tylenol/ibuprofen for discomfort, and make sure to drink plenty of fluids ?-You'll still be contagious for 24 hours after starting the antibiotic. This means you can go back to work in 1 day.  ?-Make sure to throw out your toothbrush after 24 hours so you don't give the strep back to yourself.  ?-Seek additional medical attention if symptoms are getting worse instead of better- trouble swallowing, shortness of breath, voice changes, etc. ? ?

## 2021-08-11 ENCOUNTER — Ambulatory Visit
Admission: EM | Admit: 2021-08-11 | Discharge: 2021-08-11 | Disposition: A | Discharge status: Home / Self Care | Payer: Managed Care, Other (non HMO) | Attending: Internal Medicine | Admitting: Internal Medicine

## 2021-08-11 ENCOUNTER — Encounter: Payer: Self-pay | Admitting: Emergency Medicine

## 2021-08-11 DIAGNOSIS — J020 Streptococcal pharyngitis: Secondary | ICD-10-CM

## 2021-08-11 LAB — POCT RAPID STREP A (OFFICE): Rapid Strep A Screen: POSITIVE — AB

## 2021-08-11 MED ORDER — AZITHROMYCIN 500 MG PO TABS: 500.0000 mg | ORAL_TABLET | Freq: Every day | ORAL | 0 refills | Status: DC

## 2021-08-11 NOTE — ED Triage Notes (Signed)
Pt c/o ST started today. She just completed abx 3 days ago for strep throat.
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
## 2021-10-03 ENCOUNTER — Other Ambulatory Visit (HOSPITAL_COMMUNITY)
Admission: RE | Admit: 2021-10-03 | Discharge: 2021-10-03 | Disposition: A | Discharge status: Home / Self Care | Payer: Managed Care, Other (non HMO) | Source: Ambulatory Visit | Attending: Obstetrics and Gynecology | Admitting: Obstetrics and Gynecology

## 2021-10-03 ENCOUNTER — Ambulatory Visit: Payer: Managed Care, Other (non HMO) | Admitting: Obstetrics and Gynecology

## 2021-10-03 VITALS — Ht 61.0 in | Wt 168.0 lb

## 2021-10-03 DIAGNOSIS — B3731 Acute candidiasis of vulva and vagina: Secondary | ICD-10-CM | POA: Insufficient documentation

## 2021-10-03 DIAGNOSIS — N949 Unspecified condition associated with female genital organs and menstrual cycle: Secondary | ICD-10-CM | POA: Insufficient documentation

## 2021-10-06 LAB — CERVICOVAGINAL ANCILLARY ONLY
Bacterial Vaginitis (gardnerella): NEGATIVE
Candida Glabrata: NEGATIVE
Candida Vaginitis: POSITIVE — AB
Chlamydia: NEGATIVE
Comment: NEGATIVE
Comment: NEGATIVE
Comment: NEGATIVE
Comment: NEGATIVE
Comment: NEGATIVE
Comment: NORMAL
Neisseria Gonorrhea: NEGATIVE
Trichomonas: NEGATIVE

## 2021-10-07 ENCOUNTER — Other Ambulatory Visit: Payer: Self-pay

## 2021-10-07 DIAGNOSIS — B379 Candidiasis, unspecified: Secondary | ICD-10-CM

## 2021-10-07 MED ORDER — FLUCONAZOLE 150 MG PO TABS: 150.0000 mg | ORAL_TABLET | Freq: Once | ORAL | 0 refills | Status: AC

## 2021-10-07 NOTE — Progress Notes (Signed)
Per office protocol Diflucan sent in to pharmacy on file due to positive yeast infection.

## 2021-11-22 ENCOUNTER — Ambulatory Visit
Admission: EM | Admit: 2021-11-22 | Discharge: 2021-11-22 | Disposition: A | Discharge status: Home / Self Care | Payer: Managed Care, Other (non HMO) | Attending: Family Medicine | Admitting: Family Medicine

## 2021-11-22 DIAGNOSIS — Z113 Encounter for screening for infections with a predominantly sexual mode of transmission: Secondary | ICD-10-CM | POA: Diagnosis present

## 2021-11-22 DIAGNOSIS — J038 Acute tonsillitis due to other specified organisms: Secondary | ICD-10-CM | POA: Diagnosis not present

## 2021-11-22 DIAGNOSIS — J029 Acute pharyngitis, unspecified: Secondary | ICD-10-CM | POA: Insufficient documentation

## 2021-11-22 DIAGNOSIS — B9689 Other specified bacterial agents as the cause of diseases classified elsewhere: Secondary | ICD-10-CM | POA: Insufficient documentation

## 2021-11-22 LAB — POCT RAPID STREP A (OFFICE): Rapid Strep A Screen: NEGATIVE

## 2021-11-22 MED ORDER — AMOXICILLIN 875 MG PO TABS: 875.0000 mg | ORAL_TABLET | Freq: Two times a day (BID) | ORAL | 0 refills | Status: DC

## 2021-11-22 NOTE — Discharge Instructions (Addendum)
Rapid strep negative. Treating you for acute tonsillitis. Complete medication as prescribed.  Also collected a vaginal cytology swab screen for STDs along with an oral cytology swab which will also screen for STDs.  If any treatment is warranted our clinical staff will contact you by phone to advise.  If all of your work-up is negative just continue with the amoxicillin prescribed for tonsillitis.  You may continue take ibuprofen as needed for acute throat pain.
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
## 2021-11-22 NOTE — ED Triage Notes (Signed)
Patient presents to [facility] for sore throat since Saturday. Has a hx of strep.  Denies fever.

## 2021-11-25 LAB — CERVICOVAGINAL ANCILLARY ONLY
Bacterial Vaginitis (gardnerella): NEGATIVE
Candida Glabrata: NEGATIVE
Candida Vaginitis: NEGATIVE
Chlamydia: NEGATIVE
Comment: NEGATIVE
Comment: NEGATIVE
Comment: NEGATIVE
Comment: NEGATIVE
Comment: NEGATIVE
Comment: NORMAL
Neisseria Gonorrhea: NEGATIVE
Trichomonas: NEGATIVE

## 2021-11-25 LAB — CYTOLOGY, (ORAL, ANAL, URETHRAL) ANCILLARY ONLY
Chlamydia: NEGATIVE
Comment: NEGATIVE
Comment: NORMAL
Neisseria Gonorrhea: NEGATIVE

## 2021-12-31 ENCOUNTER — Encounter: Payer: Self-pay | Admitting: Obstetrics and Gynecology

## 2022-01-01 ENCOUNTER — Other Ambulatory Visit (HOSPITAL_COMMUNITY)
Admission: RE | Admit: 2022-01-01 | Discharge: 2022-01-01 | Disposition: A | Discharge status: Home / Self Care | Payer: Managed Care, Other (non HMO) | Source: Ambulatory Visit | Attending: Obstetrics | Admitting: Obstetrics

## 2022-01-01 ENCOUNTER — Ambulatory Visit (INDEPENDENT_AMBULATORY_CARE_PROVIDER_SITE_OTHER): Payer: Managed Care, Other (non HMO)

## 2022-01-01 VITALS — BP 121/83 | HR 78 | Resp 16 | Ht 61.0 in | Wt 154.7 lb

## 2022-01-01 DIAGNOSIS — Z113 Encounter for screening for infections with a predominantly sexual mode of transmission: Secondary | ICD-10-CM | POA: Diagnosis present
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
## 2022-01-01 NOTE — Patient Instructions (Signed)
Genital Herpes Genital herpes is a common sexually transmitted infection (STI) that is caused by a virus. The virus spreads from person to person through contact with a sore, infected saliva, or infected skin. The virus can cause itching, blisters, and sores around the genitals or rectum. During an outbreak of infection, symptoms may last for several days and then go away. However, the virus remains in the body, so more outbreaks may happen in the future. The time between outbreaks varies and can be from months to years. Genital herpes can affect anyone. It is particularly concerning for pregnant women because the virus can be passed to the baby during delivery. Genital herpes is also a concern for people who have a weak disease-fighting system (immune system). What are the causes? This condition is caused by the herpes simplex virus, type 1 or type 2 (HSV-1 or HSV-2). The virus may spread through: Sexual contact with an infected person, including vaginal, anal, and oral sex. Contact with a herpes sore. The skin. This means that you can get herpes from an infected partner even if there are no blisters or sores present. Your partner may not know that he or she is infected. What increases the risk? You are more likely to develop this condition if: You have sex with many partners. You do not use latex or polyurethane condoms during sex. What are the signs or symptoms? Most people do not have symptoms or they have mild symptoms that may be mistaken for other skin problems. Symptoms may include: Small, red bumps near the genitals, rectum, or mouth. These bumps turn into blisters and then sores. Flu-like (influenza-like) symptoms, including: Fever. Body aches. Swollen lymph nodes. Headache. Painful urination. Pain and itching in the genital area or rectal area. Vaginal discharge. Tingling or shooting pain in the legs and buttocks. Generally, symptoms are more severe and last longer during the first  (primary) outbreak. Influenza-like symptoms are also more common during the primary outbreak. How is this diagnosed? This condition may be diagnosed based on: A physical exam. Your medical history. Blood tests. A test of a fluid sample (culture) from an open sore. How is this treated? There is no cure for this condition, but treatment with antiviral medicines can do the following: Speed up healing and relieve symptoms. Help to reduce the spread of the virus to sexual partners. Limit the chance of future outbreaks, or make future outbreaks shorter. Lessen symptoms of future outbreaks. Your health care provider may also recommend over-the-counter medicines to help with pain and itching. Follow these instructions at home: If you have an outbreak:  Keep the affected areas dry and clean. Avoid rubbing or touching blisters and sores. If you do touch blisters or sores: Wash your hands thoroughly with soap and water for at least 20 seconds. If soap and water are not available, use an alcohol-based hand sanitizer. Do not touch your eyes afterward. Sexual activity Do not have sexual contact during active outbreaks. Practice safe sex. Herpes can spread even if your partner does not have blisters or sores. Latex or polyurethane condoms and female condoms may help prevent the spread of the herpes virus. Managing pain and discomfort If directed, put ice on the painful area. To do this: Put ice in a plastic bag. Place a towel between your skin and the bag. Leave the ice on for 20 minutes, 2-3 times a day. Remove the ice if your skin turns bright red. This is very important. If you cannot feel pain, heat, or   cold, you have a greater risk of damage to the area. If told, take a cool sitz bath to help relieve pain or itching. A sitz bath is a water bath that you take while sitting down in water that is deep enough to cover your hips and buttocks. General instructions Take over-the-counter and  prescription medicines only as told by your health care provider. If you were prescribed an antiviral medicine, use it as told by your health care provider. Do not stop using the antiviral even if you start to feel better. Keep all follow-up visits. This is important. How is this prevented? Use condoms. Although you can get genital herpes during sexual contact even with the use of a condom, a condom can provide some protection. Avoid having multiple sexual partners. Talk with your sexual partner about any symptoms either of you may have. Also, talk with your partner about any history of STIs. Do not have sexual contact if you have active symptoms of genital herpes. Contact a health care provider if: Your symptoms are not improving with medicine. Your symptoms return, or you have new symptoms. You have a fever. You have abdominal pain. You have redness, swelling, or pain in your eye. You notice new sores on other parts of your body. You have had herpes and you become pregnant or plan to become pregnant. Get help right away if: You have symptoms of viral meningitis. This is rare but may happen if the virus spreads to the brain. Symptoms may include: Severe headache or stiff neck. Muscle aches. Nausea and vomiting. Sensitivity to light. Summary Genital herpes is a common sexually transmitted infection (STI) that is caused by the herpes simplex virus, type 1 or type 2 (HSV-1 or HSV-2). These viruses are most often spread through sexual contact with an infected person. You are more likely to develop this condition if you have sex with many partners or you do not use condoms during sex. Most people do not have symptoms or have mild symptoms that may be mistaken for other skin problems. Symptoms occur as outbreaks that may happen months or years apart. There is no cure for this condition, but treatment with oral antiviral medicines can reduce symptoms, reduce the chance of spreading the virus to  a partner, prevent future outbreaks, or shorten future outbreaks. This information is not intended to replace advice given to you by your health care provider. Make sure you discuss any questions you have with your health care provider. Document Revised: 12/12/2020 Document Reviewed: 12/12/2020 Elsevier Patient Education  2023 [employer]  

## 2022-01-02 LAB — HEPATITIS B SURFACE ANTIGEN: Hepatitis B Surface Ag: NEGATIVE

## 2022-01-02 LAB — HSV 1 AND 2 AB, IGG
HSV 1 Glycoprotein G Ab, IgG: 3.11 index — ABNORMAL HIGH (ref 0.00–0.90)
HSV 2 IgG, Type Spec: <0.91 index (ref 0.00–0.90)

## 2022-01-02 LAB — HIV ANTIBODY (ROUTINE TESTING W REFLEX): HIV Screen 4th Generation wRfx: NONREACTIVE

## 2022-01-02 LAB — HEPATITIS C ANTIBODY: Hep C Virus Ab: NONREACTIVE

## 2022-01-02 LAB — RPR: RPR Ser Ql: NONREACTIVE

## 2022-01-05 LAB — CERVICOVAGINAL ANCILLARY ONLY
Bacterial Vaginitis (gardnerella): NEGATIVE
Candida Glabrata: NEGATIVE
Candida Vaginitis: NEGATIVE
Chlamydia: NEGATIVE
Comment: NEGATIVE
Comment: NEGATIVE
Comment: NEGATIVE
Comment: NEGATIVE
Comment: NEGATIVE
Comment: NORMAL
Neisseria Gonorrhea: NEGATIVE
Trichomonas: NEGATIVE

## 2022-01-07 ENCOUNTER — Telehealth: Payer: Self-pay

## 2022-01-07 NOTE — Telephone Encounter (Signed)
Patient contacted office because she was seen for nurse visit on 01/01/22 and has not heard back about her lab results, patient is asking if results can be reviewed and she be advised if she needs to repeat any testing. KW

## 2022-05-08 DIAGNOSIS — N898 Other specified noninflammatory disorders of vagina: Secondary | ICD-10-CM | POA: Insufficient documentation

## 2022-05-08 NOTE — Progress Notes (Signed)
    NURSE VISIT NOTE  Subjective:    Patient ID: Carly Williams, female    DOB: 07/24/2000, 22 y.o.   MRN: [id number]  HPI  Patient is a 22 y.o. G37P1001 female who presents for vaginal irritation and pain for three days  Denies abnormal vaginal bleeding or significant pelvic pain or fever. denies dysuria, hematuria, urinary frequency, urinary urgency, flank pain, abdominal pain, pelvic pain, genital rash, and vaginal discharge.  Patient reports that she took over the counter monistat and states that symptoms have since cleared but is still requesting testing for STI/STD.  Patient has history of known exposure to STD.   Objective:    BP 120/73   Pulse 65   Wt 166 lb 9.6 oz (75.6 kg)   Breastfeeding No   BMI 31.48 kg/m    @THIS  VISIT ONLY@  Assessment:   1. Vaginal irritation     monilia vaginitis and nonspecific vaginitis  Plan:   GC and chlamydia DNA  probe sent to lab. Treatment: abstain from coitus during course of treatment and will await results of nuswab.  ROV prn if symptoms persist or worsen.   Fonda Kinder, CMA

## 2022-05-11 ENCOUNTER — Ambulatory Visit (INDEPENDENT_AMBULATORY_CARE_PROVIDER_SITE_OTHER): Payer: Managed Care, Other (non HMO)

## 2022-05-11 VITALS — BP 120/73 | HR 65 | Wt 166.6 lb

## 2022-05-11 DIAGNOSIS — N898 Other specified noninflammatory disorders of vagina: Secondary | ICD-10-CM

## 2022-05-11 NOTE — Patient Instructions (Signed)
Vaginitis  Vaginitis is irritation and swelling of the vagina. Treatment will depend on the cause. What are the causes? It can be caused by: Bacteria. Yeast. A parasite. A virus. Low hormone levels. Bubble baths, scented tampons, and feminine sprays. Other things can change the balance of the yeast and bacteria that live in the vagina. These include: Antibiotic medicines. Not being clean enough. Some birth control methods. Sex. Infection. Diabetes. A weakened body defense system (immune system). What increases the risk? Smoking or being around someone who smokes. Using washes (douches), scented tampons, or scented pads. Wearing tight pants or thong underwear. Using birth control pills or an IUD. Having sex without a condom or having a lot of partners. Having an STI. Using a certain product to kill sperm (nonoxynol-9). Eating foods that are high in sugar. Having diabetes. Having low levels of a female hormone. Having a weakened body defense system. Being pregnant or breastfeeding. What are the signs or symptoms? Fluid coming from the vagina that is not normal. A bad smell. Itching, pain, or swelling. Pain with sex. Pain or burning when you pee (urinate). Sometimes there are no symptoms. How is this treated? Treatment may include: Antibiotic creams or pills. Antifungal medicines. Medicines to ease symptoms if you have a virus. Your sex partner should also be treated. Estrogen medicines. Avoiding scented soaps, sprays, or douches. Stopping use of products that caused irritation and then using a cream to treat symptoms. Follow these instructions at home: Lifestyle Keep the area around your vagina clean and dry. Avoid using soap. Rinse the area with water. Until your doctor says it is okay: Do not use washes for the vagina. Do not use tampons. Do not have sex. Wipe from front to back after going to the bathroom. When your doctor says it is okay, practice safe sex  and use condoms. General instructions Take over-the-counter and prescription medicines only as told by your doctor. If you were prescribed an antibiotic medicine, take or use it as told by your doctor. Do not stop taking or using it even if you start to feel better. Keep all follow-up visits. How is this prevented? Do not use things that can irritate the vagina, such as fabric softeners. Avoid these products if they are scented: Sprays. Detergents. Tampons. Products for cleaning the vagina. Soaps or bubble baths. Let air reach your vagina. To do this: Wear cotton underwear. Do not wear: Underwear while you sleep. Tight pants. Thong underwear. Underwear or nylons without a cotton panel. Take off any wet clothing, such as bathing suits, as soon as you can. Practice safe sex and use condoms. Contact a doctor if: You have pain in your belly or in the area between your hips. You have a fever or chills. Your symptoms last for more than 2-3 days. Get help right away if: You have a fever and your symptoms get worse all of a sudden. Summary Vaginitis is irritation and swelling of the vagina. Treatment will depend on the cause of the condition. Do not use washes or tampons or have sex until your doctor says it is okay. This information is not intended to replace advice given to you by your health care provider. Make sure you discuss any questions you have with your health care provider. Document Revised: 09/07/2019 Document Reviewed: 09/07/2019 Elsevier Patient Education  [place].

## 2022-05-14 LAB — NUSWAB VAGINITIS PLUS (VG+)
Candida albicans, NAA: NEGATIVE
Candida glabrata, NAA: NEGATIVE
Chlamydia trachomatis, NAA: NEGATIVE
Neisseria gonorrhoeae, NAA: NEGATIVE
Trich vag by NAA: NEGATIVE

## 2022-05-16 ENCOUNTER — Encounter: Payer: Self-pay | Admitting: Certified Nurse Midwife

## 2022-08-20 ENCOUNTER — Ambulatory Visit (INDEPENDENT_AMBULATORY_CARE_PROVIDER_SITE_OTHER): Payer: Managed Care, Other (non HMO)

## 2022-08-20 VITALS — BP 100/64 | Ht 61.0 in | Wt 175.0 lb

## 2022-08-20 DIAGNOSIS — N898 Other specified noninflammatory disorders of vagina: Secondary | ICD-10-CM

## 2022-08-20 DIAGNOSIS — Z113 Encounter for screening for infections with a predominantly sexual mode of transmission: Secondary | ICD-10-CM

## 2022-08-20 NOTE — Progress Notes (Addendum)
    NURSE VISIT NOTE  Subjective:    Patient ID: Candie Mile, female    DOB: 01-26-2001, 22 y.o.   MRN: [id number]  HPI  Patient is a 22 y.o. G55P1001 female who presents for self swab nurse visit for vaginal odor. Denies vaginal discharge, itching, irritation. Denies abnormal vaginal bleeding or significant pelvic pain or fever. Denies  UTI symptoms . Patient does not has history of known exposure to STD.   Objective:    BP 100/64   Ht 5\' 1"  (1.549 m)   Wt 175 lb (79.4 kg)   LMP 07/29/2022 (Exact Date)   Breastfeeding No   BMI 33.07 kg/m      Assessment:   1. Vaginal odor   2. Screening for STD (sexually transmitted disease)      Plan:    NuSwab sent to lab. Treatment: Will wait for results to be treated if needed. ROV prn if symptoms persist or worsen.   Donnetta Hail, CMA

## 2022-08-20 NOTE — Patient Instructions (Addendum)
Chlamydia, Female  Chlamydia is a sexually transmitted infection (STI). This infection spreads through sexual contact. Chlamydia can occur in different areas of the body, including: The urethra. This is the part of the body that drains urine from the bladder. The cervix. This is the lowest part of the uterus. The throat. The rectum. This condition is not difficult to treat. However, if left untreated, chlamydia can lead to more serious health problems, including pelvic inflammatory disease (PID). PID can increase your risk of being unable to have children. In pregnant women, untreated chlamydia can cause serious complications during pregnancy or health problems for the baby after delivery. What are the causes? This condition is caused by a bacteria called Chlamydia trachomatis. The bacteria are spread from an infected partner during sexual activity. Chlamydia can spread through contact with the genitals, mouth, or rectum. What increases the risk? The following factors may make you more likely to develop this condition: Not using a condom the right way or not using a condom every time you have sex. Having a new sex partner or having more than one sex partner. Being sexually active before age 23. What are the signs or symptoms? In some cases, there are no symptoms, especially early in the infection. If symptoms develop, they may include: Urinating often, or a burning feeling during urination. Redness, soreness, or swelling of the vagina or rectum. Discharge coming from the vagina or rectum. Pain in the abdomen. Pain during sex. Bleeding between menstrual periods or irregular periods. How is this diagnosed? This condition may be diagnosed with: Urine tests. Swab tests. Depending on your symptoms, your health care provider may use a cotton swab to collect a fluid sample from your vagina, rectum, nose, or throat to test for the bacteria. A pelvic exam. How is this treated? This condition is  treated with antibiotic medicines. Follow these instructions at home: Sexual activity Tell your sex partner or partners about your infection. These include any partners for oral, anal, or vaginal sex that you have had within 60 days of when your symptoms started. Sex partners should also be treated, even if they have no signs of the infection. Do not have sex until you and your sex partners have completed treatment and your health care provider says it is okay. If your health care provider prescribed you a single-dose medicine as treatment, wait at least 7 days after taking the medicine before having sex. General instructions Take over-the-counter and prescription medicines as told by your health care provider. Finish all antibiotic medicine even when you start to feel better. It is up to you to get your test results. Ask your health care provider, or the department that is doing the test, when your results will be ready. Keep all follow-up visits. This is important. You may need to be tested for infection again 3 months after treatment. How is this prevented? You can lower your risk of getting chlamydia by: Using latex or polyurethane condoms correctly every time you have sex. Not having multiple sex partners. Asking if your sex partner has been tested for STIs and had negative results. Getting regular health screenings to check for STIs. Contact a health care provider if: You develop new symptoms or your symptoms are getting worse. Your symptoms do not get better after treatment. You have a fever or chills. You have pain during sex. You have irregular menstrual periods, or you have bleeding between periods or after sex. You develop flu-like symptoms, such as night sweats, sore throat,  or muscle aches. You are unable to take your antibiotic medicine as prescribed. Summary Chlamydia is a sexually transmitted infection (STI) that is caused by bacteria. This infection spreads through sexual  contact. This condition is treated with antibiotic medicines. If left untreated, chlamydia can lead to more serious health problems, including pelvic inflammatory disease (PID). Your sex partners will also need to be treated. Do not have sex until both you and your partner have been treated. Take medicines as directed by your health care provider and keep all follow-up visits to ensure your infection has been completely treated. This information is not intended to replace advice given to you by your health care provider. Make sure you discuss any questions you have with your health care provider. Document Revised: 12/30/2020 Document Reviewed: 12/30/2020 Elsevier Patient Education  2024 [employer]  Vaginitis  Vaginitis is irritation and swelling of the vagina. Treatment will depend on the cause. What are the causes? It can be caused by: Bacteria. Yeast. A parasite. A virus. Low hormone levels. Bubble baths, scented tampons, and feminine sprays. Other things can change the balance of the yeast and bacteria that live in the vagina. These include: Antibiotic medicines. Not being clean enough. Some birth control methods. Sex. Infection. Diabetes. A weakened body defense system (immune system). What increases the risk? Smoking or being around someone who smokes. Using washes (douches), scented tampons, or scented pads. Wearing tight pants or thong underwear. Using birth control pills or an IUD. Having sex without a condom or having a lot of partners. Having an STI. Using a certain product to kill sperm (nonoxynol-9). Eating foods that are high in sugar. Having diabetes. Having low levels of a female hormone. Having a weakened body defense system. Being pregnant or breastfeeding. What are the signs or symptoms? Fluid coming from the vagina that is not normal. A bad smell. Itching, pain, or swelling. Pain with sex. Pain or burning when you pee (urinate). Sometimes there are  no symptoms. How is this treated? Treatment may include: Antibiotic creams or pills. Antifungal medicines. Medicines to ease symptoms if you have a virus. Your sex partner should also be treated. Estrogen medicines. Avoiding scented soaps, sprays, or douches. Stopping use of products that caused irritation and then using a cream to treat symptoms. Follow these instructions at home: Lifestyle Keep the area around your vagina clean and dry. Avoid using soap. Rinse the area with water. Until your doctor says it is okay: Do not use washes for the vagina. Do not use tampons. Do not have sex. Wipe from front to back after going to the bathroom. When your doctor says it is okay, practice safe sex and use condoms. General instructions Take over-the-counter and prescription medicines only as told by your doctor. If you were prescribed an antibiotic medicine, take or use it as told by your doctor. Do not stop taking or using it even if you start to feel better. Keep all follow-up visits. How is this prevented? Do not use things that can irritate the vagina, such as fabric softeners. Avoid these products if they are scented: Sprays. Detergents. Tampons. Products for cleaning the vagina. Soaps or bubble baths. Let air reach your vagina. To do this: Wear cotton underwear. Do not wear: Underwear while you sleep. Tight pants. Thong underwear. Underwear or nylons without a cotton panel. Take off any wet clothing, such as bathing suits, as soon as you can. Practice safe sex and use condoms. Contact a doctor if: You have pain in  your belly or in the area between your hips. You have a fever or chills. Your symptoms last for more than 2-3 days. Get help right away if: You have a fever and your symptoms get worse all of a sudden. Summary Vaginitis is irritation and swelling of the vagina. Treatment will depend on the cause of the condition. Do not use washes or tampons or have sex until  your doctor says it is okay. This information is not intended to replace advice given to you by your health care provider. Make sure you discuss any questions you have with your health care provider. Document Revised: 09/07/2019 Document Reviewed: 09/07/2019 Elsevier Patient Education  2024 [employer].

## 2022-08-23 LAB — NUSWAB VAGINITIS PLUS (VG+)
Atopobium vaginae: HIGH Score — AB
BVAB 2: HIGH Score — AB
Candida albicans, NAA: NEGATIVE
Candida glabrata, NAA: NEGATIVE
Chlamydia trachomatis, NAA: NEGATIVE
Megasphaera 1: HIGH Score — AB
Neisseria gonorrhoeae, NAA: NEGATIVE
Trich vag by NAA: NEGATIVE

## 2022-08-24 ENCOUNTER — Other Ambulatory Visit: Payer: Self-pay | Admitting: Obstetrics and Gynecology

## 2022-08-24 ENCOUNTER — Other Ambulatory Visit: Payer: Self-pay

## 2022-08-24 MED ORDER — METRONIDAZOLE 0.75 % VA GEL: 1.0000 | Freq: Every day | VAGINAL | 0 refills | Status: DC

## 2022-08-24 MED ORDER — CLINDAMYCIN HCL 300 MG PO CAPS: 300.0000 mg | ORAL_CAPSULE | Freq: Two times a day (BID) | ORAL | 0 refills | Status: AC

## 2022-09-24 ENCOUNTER — Ambulatory Visit
Admission: EM | Admit: 2022-09-24 | Discharge: 2022-09-24 | Disposition: A | Discharge status: Home / Self Care | Payer: Managed Care, Other (non HMO) | Attending: Emergency Medicine | Admitting: Emergency Medicine

## 2022-09-24 DIAGNOSIS — J029 Acute pharyngitis, unspecified: Secondary | ICD-10-CM | POA: Insufficient documentation

## 2022-09-24 LAB — POCT RAPID STREP A (OFFICE): Rapid Strep A Screen: NEGATIVE

## 2022-09-24 NOTE — Discharge Instructions (Addendum)
Your rapid strep test is negative.  A throat culture is pending; we will call you if it is positive requiring treatment.    Follow up with your primary care provider if your symptoms are not improving.     

## 2022-09-24 NOTE — ED Triage Notes (Signed)
Patient to Urgent Care with complaints of  sore throat/ headaches that started two days ago. Hx of frequent strep.  Possible fever yesterday. Taking ibuprofen.
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
## 2022-09-25 LAB — CULTURE, GROUP A STREP (THRC)

## 2023-01-06 ENCOUNTER — Encounter: Payer: Self-pay | Admitting: Obstetrics and Gynecology

## 2023-01-06 ENCOUNTER — Ambulatory Visit (INDEPENDENT_AMBULATORY_CARE_PROVIDER_SITE_OTHER): Payer: Managed Care, Other (non HMO) | Admitting: Obstetrics and Gynecology

## 2023-01-06 VITALS — BP 109/77 | HR 62 | Ht 61.0 in | Wt 159.8 lb

## 2023-01-06 DIAGNOSIS — Z30431 Encounter for routine checking of intrauterine contraceptive device: Secondary | ICD-10-CM

## 2023-01-06 DIAGNOSIS — Z113 Encounter for screening for infections with a predominantly sexual mode of transmission: Secondary | ICD-10-CM

## 2023-01-06 DIAGNOSIS — Z01419 Encounter for gynecological examination (general) (routine) without abnormal findings: Secondary | ICD-10-CM | POA: Diagnosis not present

## 2023-01-06 NOTE — Progress Notes (Signed)
Patients presents for annual exam today. She states wanting an IUD check. Requesting STD screening. Up to date on pap smear. She states no other questions or concerns at this time.
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
## 2023-01-07 LAB — HEPATITIS C ANTIBODY: Hep C Virus Ab: NONREACTIVE

## 2023-01-07 LAB — HIV ANTIBODY (ROUTINE TESTING W REFLEX): HIV Screen 4th Generation wRfx: NONREACTIVE

## 2023-01-07 LAB — HEPATITIS B SURFACE ANTIGEN: Hepatitis B Surface Ag: NEGATIVE

## 2023-01-07 LAB — RPR: RPR Ser Ql: NONREACTIVE

## 2023-01-09 LAB — NUSWAB VAGINITIS PLUS (VG+)
BVAB 2: HIGH {score} — AB
Candida albicans, NAA: NEGATIVE
Candida glabrata, NAA: NEGATIVE
Chlamydia trachomatis, NAA: NEGATIVE
Megasphaera 1: HIGH {score} — AB
Neisseria gonorrhoeae, NAA: NEGATIVE
Trich vag by NAA: NEGATIVE

## 2023-01-11 ENCOUNTER — Other Ambulatory Visit: Payer: Self-pay

## 2023-01-11 DIAGNOSIS — N760 Acute vaginitis: Secondary | ICD-10-CM

## 2023-01-11 MED ORDER — CLINDAMYCIN HCL 300 MG PO CAPS: 300.0000 mg | ORAL_CAPSULE | Freq: Two times a day (BID) | ORAL | 0 refills | Status: DC

## 2023-01-23 ENCOUNTER — Ambulatory Visit
Admission: EM | Admit: 2023-01-23 | Discharge: 2023-01-23 | Disposition: A | Discharge status: Home / Self Care | Payer: Managed Care, Other (non HMO) | Attending: Emergency Medicine | Admitting: Emergency Medicine

## 2023-01-23 DIAGNOSIS — N949 Unspecified condition associated with female genital organs and menstrual cycle: Secondary | ICD-10-CM | POA: Diagnosis not present

## 2023-01-23 DIAGNOSIS — N898 Other specified noninflammatory disorders of vagina: Secondary | ICD-10-CM | POA: Diagnosis not present

## 2023-01-23 LAB — POCT URINE PREGNANCY: Preg Test, Ur: NEGATIVE

## 2023-01-23 MED ORDER — FLUCONAZOLE 150 MG PO TABS: 150.0000 mg | ORAL_TABLET | Freq: Once | ORAL | 0 refills | Status: AC
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
## 2023-01-23 NOTE — Discharge Instructions (Addendum)
Take the Diflucan as directed.  Your tests are pending.  We will call you if you need additional treatment.  Follow up with your gynecologist.

## 2023-01-25 LAB — CERVICOVAGINAL ANCILLARY ONLY
Bacterial Vaginitis (gardnerella): NEGATIVE
Candida Glabrata: NEGATIVE
Candida Vaginitis: POSITIVE — AB
Comment: NEGATIVE
Comment: NEGATIVE
Comment: NEGATIVE

## 2023-03-23 ENCOUNTER — Emergency Department
Admission: EM | Admit: 2023-03-23 | Discharge: 2023-03-23 | Discharge status: Against Medical Advice | Payer: Managed Care, Other (non HMO) | Attending: Emergency Medicine | Admitting: Emergency Medicine

## 2023-03-23 ENCOUNTER — Encounter: Payer: Self-pay | Admitting: Emergency Medicine

## 2023-03-23 ENCOUNTER — Emergency Department: Payer: Managed Care, Other (non HMO)

## 2023-03-23 ENCOUNTER — Other Ambulatory Visit: Payer: Self-pay

## 2023-03-23 DIAGNOSIS — R079 Chest pain, unspecified: Secondary | ICD-10-CM | POA: Insufficient documentation

## 2023-03-23 DIAGNOSIS — Z5321 Procedure and treatment not carried out due to patient leaving prior to being seen by health care provider: Secondary | ICD-10-CM | POA: Insufficient documentation

## 2023-03-23 LAB — CBC
HCT: 38.7 % (ref 36.0–46.0)
Hemoglobin: 12.6 g/dL (ref 12.0–15.0)
MCH: 29.9 pg (ref 26.0–34.0)
MCHC: 32.6 g/dL (ref 30.0–36.0)
MCV: 91.9 fL (ref 80.0–100.0)
Platelets: 255 10*3/uL (ref 150–400)
RBC: 4.21 MIL/uL (ref 3.87–5.11)
RDW: 13.4 % (ref 11.5–15.5)
WBC: 9.4 10*3/uL (ref 4.0–10.5)
nRBC: 0 % (ref 0.0–0.2)

## 2023-03-23 LAB — BASIC METABOLIC PANEL
Anion gap: 9 (ref 5–15)
BUN: 13 mg/dL (ref 6–20)
CO2: 23 mmol/L (ref 22–32)
Calcium: 9.2 mg/dL (ref 8.9–10.3)
Chloride: 104 mmol/L (ref 98–111)
Creatinine, Ser: 0.7 mg/dL (ref 0.44–1.00)
GFR, Estimated: >60 mL/min (ref >60)
Glucose, Bld: 92 mg/dL (ref 70–99)
Potassium: 4 mmol/L (ref 3.5–5.1)
Sodium: 136 mmol/L (ref 135–145)

## 2023-03-23 LAB — TROPONIN I (HIGH SENSITIVITY): Troponin I (High Sensitivity): 3 ng/L (ref <18)

## 2023-03-23 NOTE — ED Notes (Signed)
No answer when called several times from lobby 

## 2023-03-23 NOTE — ED Triage Notes (Signed)
 Patient to ED via POV for centralized CP- worse with inhalation. Started yesterday and radiates into left arm. Denies cardiac hx.

## 2023-03-23 NOTE — ED Provider Triage Note (Signed)
 Emergency Medicine Provider Triage Evaluation Note  Carly Williams , a 22 y.o. female  was evaluated in triage.  Pt complains of chest pain, that increased with deep inhalation.  Review of Systems  Positive:  Negative:   Physical Exam  Ht 5' 1 (1.549 m)   Wt 68 kg   BMI 28.34 kg/m  Gen:   Awake, no distress   Resp:  Normal effort.  Tenderness reproducible to palpation 5 intercostal space with left middle clavicular line MSK:   Moves extremities without difficulty  Other:    Medical Decision Making  Medically screening exam initiated at 5:56 PM.  Appropriate orders placed.  Carly Williams was informed that the remainder of the evaluation will be completed by another provider, this initial triage assessment does not replace that evaluation, and the importance of remaining in the ED until their evaluation is complete.  Pain is reproducible to palpation in the left hemithorax patient will have of the labs for shortness of breath   Carly Kast, PA-C 03/23/23 1758

## 2023-10-08 ENCOUNTER — Ambulatory Visit

## 2023-10-08 VITALS — BP 110/81 | HR 74 | Ht 61.0 in | Wt 175.0 lb

## 2023-10-08 DIAGNOSIS — Z113 Encounter for screening for infections with a predominantly sexual mode of transmission: Secondary | ICD-10-CM

## 2023-10-08 NOTE — Progress Notes (Signed)
    NURSE VISIT NOTE  Subjective:    Patient ID: Carly Williams, female    DOB: 11/27/2000, 23 y.o.   MRN: [id number]  HPI  Patient is a 23 y.o. [id number] female who presents for std check change in partner and would like be checked.   Objective:    Ht 5' 1 (1.549 m)   Wt 175 lb (79.4 kg)   BMI 33.07 kg/m     Assessment:   1. Screening for STDs (sexually transmitted diseases)       Plan:   GC and chlamydia DNA  probe sent to lab. Treatment: abstain from coitus during course of treatment ROV prn if symptoms persist or worsen.   Eva Vallee H Nesanel Aguila, CMA

## 2023-10-12 LAB — NUSWAB VAGINITIS PLUS (VG+)
Atopobium vaginae: HIGH {score} — AB
BVAB 2: HIGH {score} — AB
Candida albicans, NAA: NEGATIVE
Candida glabrata, NAA: NEGATIVE
Chlamydia trachomatis, NAA: NEGATIVE
Megasphaera 1: HIGH {score} — AB
Neisseria gonorrhoeae, NAA: NEGATIVE
Trich vag by NAA: NEGATIVE

## 2023-10-13 ENCOUNTER — Encounter: Payer: Self-pay | Admitting: Certified Nurse Midwife

## 2023-10-13 ENCOUNTER — Other Ambulatory Visit: Payer: Self-pay | Admitting: Certified Nurse Midwife

## 2023-10-13 DIAGNOSIS — B9689 Other specified bacterial agents as the cause of diseases classified elsewhere: Secondary | ICD-10-CM

## 2023-10-13 MED ORDER — CLINDAMYCIN HCL 300 MG PO CAPS: 300.0000 mg | ORAL_CAPSULE | Freq: Two times a day (BID) | ORAL | 0 refills | Status: AC

## 2023-11-08 ENCOUNTER — Emergency Department (HOSPITAL_BASED_OUTPATIENT_CLINIC_OR_DEPARTMENT_OTHER)
Admission: EM | Admit: 2023-11-08 | Discharge: 2023-11-08 | Disposition: A | Discharge status: Home / Self Care | Attending: Emergency Medicine | Admitting: Emergency Medicine

## 2023-11-08 ENCOUNTER — Other Ambulatory Visit: Payer: Self-pay

## 2023-11-08 DIAGNOSIS — J020 Streptococcal pharyngitis: Secondary | ICD-10-CM | POA: Diagnosis not present

## 2023-11-08 DIAGNOSIS — J029 Acute pharyngitis, unspecified: Secondary | ICD-10-CM | POA: Diagnosis present

## 2023-11-08 LAB — RESP PANEL BY RT-PCR (RSV, FLU A&B, COVID)  RVPGX2
Influenza A by PCR: NEGATIVE
Influenza B by PCR: NEGATIVE
Resp Syncytial Virus by PCR: NEGATIVE
SARS Coronavirus 2 by RT PCR: NEGATIVE

## 2023-11-08 LAB — GROUP A STREP BY PCR: Group A Strep by PCR: DETECTED — AB

## 2023-11-08 MED ORDER — KETOROLAC TROMETHAMINE 15 MG/ML IJ SOLN
15.0000 mg | Freq: Once | INTRAMUSCULAR | Status: AC
Start: 2023-11-08 — End: 2023-11-08
  Administered 2023-11-08: 15 mg via INTRAMUSCULAR
  Filled 2023-11-08: qty 1

## 2023-11-08 MED ORDER — AMOXICILLIN 500 MG PO CAPS: 1000.0000 mg | ORAL_CAPSULE | Freq: Every day | ORAL | 0 refills | Status: AC

## 2023-11-08 MED ORDER — DEXAMETHASONE SODIUM PHOSPHATE 10 MG/ML IJ SOLN
10.0000 mg | Freq: Once | INTRAMUSCULAR | Status: AC
  Administered 2023-11-08: 10 mg via INTRAMUSCULAR
  Filled 2023-11-08: qty 1

## 2023-11-08 NOTE — Discharge Instructions (Signed)
 Please read and follow all provided instructions.  Your diagnoses today include:  1. Strep throat    Tests performed today include: Strep test: was POSITIVE for strep throat Covid test: was negative Vital signs. See below for your results today.   Medications prescribed:  Amoxicillin  - antibiotic  You have been prescribed an antibiotic medicine: take the entire course of medicine even if you are feeling better. Stopping early can cause the antibiotic not to work.  Please use over-the-counter NSAID medications (ibuprofen , naproxen) or Tylenol  (acetaminophen ) as directed on the packaging for pain -- as long as you do not have any reasons avoid these medications. Reasons to avoid NSAID medications include: weak kidneys, a history of bleeding in your stomach or gut, or uncontrolled high blood pressure or previous heart attack. Reasons to avoid Tylenol  include: liver problems or ongoing alcohol use. Never take more than 4000mg  or 8 Extra strength Tylenol  in a 24 hour period.     Take any medications prescribed only as directed.   Home care instructions:  Please read the educational materials provided and follow any instructions contained in this packet.  Follow-up instructions: Please follow-up with your primary care provider as needed for further evaluation of your symptoms.  Return instructions:  Please return to the Emergency Department if you experience worsening symptoms.  Return if you have worsening problems swallowing, your neck becomes swollen, you cannot swallow your saliva or your voice becomes muffled.  Return with high persistent fever, persistent vomiting, or if you have trouble breathing.  Please return if you have any other emergent concerns.  Additional Information:  Your vital signs today were: BP 123/75 (BP Location: Right Arm)   Pulse (!) 114   Temp 99.6 F (37.6 C) (Oral)   Resp 18   SpO2 99%  If your blood pressure (BP) was elevated above 135/85 this visit,  please have this repeated by your doctor within one month. --------------

## 2023-11-08 NOTE — ED Provider Notes (Signed)
  EMERGENCY DEPARTMENT AT [facility] Provider Note   CSN: [id number] Arrival date & time: 11/08/23  9148     Patient presents with: Sore Throat   Carly Williams is a 23 y.o. female.   Patient presents to the emergency department for evaluation of sore throat, chills, headache starting yesterday.  Also generalized bodyaches.  No known sick contacts.  Having a lot of difficulty swallowing due to pain.  She took ibuprofen  at home.  No ear pain, chest pain, shortness of breath.        Prior to Admission medications   Medication Sig Start Date End Date Taking? Authorizing Provider  albuterol  (VENTOLIN  HFA) 108 (90 Base) MCG/ACT inhaler Inhale 2 puffs into the lungs every 6 (six) hours as needed for wheezing or shortness of breath. 01/16/19   Osker Tinnie HERO, FNP  clindamycin  (CLEOCIN ) 300 MG capsule Take 1 capsule (300 mg total) by mouth 2 (two) times daily. For seven days 10/13/23   Sebastian Sham, CNM  levonorgestrel  (MIRENA ) 20 MCG/DAY IUD 1 each by Intrauterine route once.    [provider]    Allergies: Metronidazole     Review of Systems  Updated Vital Signs BP 123/75 (BP Location: Right Arm)   Pulse (!) 114   Temp 99.6 F (37.6 C) (Oral)   Resp 18   SpO2 99%   Physical Exam Vitals and nursing note reviewed.  Constitutional:      General: She is in acute distress (Tearful).     Appearance: She is well-developed.  HENT:     Head: Normocephalic and atraumatic.     Jaw: No trismus.     Right Ear: Tympanic membrane, ear canal and external ear normal.     Left Ear: Tympanic membrane, ear canal and external ear normal.     Nose: Nose normal. No mucosal edema or rhinorrhea.     Mouth/Throat:     Mouth: Mucous membranes are moist. Mucous membranes are not dry. No oral lesions.     Pharynx: Uvula midline. Oropharyngeal exudate and posterior oropharyngeal erythema present. No uvula swelling.     Tonsils: No tonsillar abscesses.      Comments: Posterior oropharynx is extremely inflamed. Eyes:     General:        Right eye: No discharge.        Left eye: No discharge.     Conjunctiva/sclera: Conjunctivae normal.  Cardiovascular:     Rate and Rhythm: Normal rate and regular rhythm.     Heart sounds: Normal heart sounds.  Pulmonary:     Effort: Pulmonary effort is normal. No respiratory distress.     Breath sounds: Normal breath sounds. No wheezing or rales.  Abdominal:     Palpations: Abdomen is soft.     Tenderness: There is no abdominal tenderness.  Musculoskeletal:     Cervical back: Normal range of motion and neck supple.  Lymphadenopathy:     Cervical: No cervical adenopathy.  Skin:    General: Skin is warm and dry.  Neurological:     Mental Status: She is alert.  Psychiatric:        Mood and Affect: Mood normal.     (all labs ordered are listed, but only abnormal results are displayed) Labs Reviewed  GROUP A STREP BY PCR - Abnormal; Notable for the following components:      Result Value   Group A Strep by PCR DETECTED (*)    All other components within normal  limits  RESP PANEL BY RT-PCR (RSV, FLU A&B, COVID)  [id number]    EKG: None  Radiology: No results found.   Procedures   Medications Ordered in the ED - No data to display  ED Course  Patient seen and examined. History obtained directly from patient. Work-up including labs, imaging, EKG ordered in triage, if performed, were reviewed.    Labs/EKG: Independently reviewed and interpreted.  This included: Strep test, positive; flu, COVID, RSV testing, negative.  Imaging: None ordered  Medications/Fluids: Ordered: IM Toradol , IM Decadron .  Most recent vital signs reviewed and are as follows: BP 123/75 (BP Location: Right Arm)   Pulse (!) 114   Temp 99.6 F (37.6 C) (Oral)   Resp 18   SpO2 99%   Initial impression: Streptococcal pharyngitis  Home treatment plan: Oral amoxicillin , maintain good hydration, continue  NSAIDs  Return instructions discussed with patient: Inability to swallow, uncontrolled pain, persistent vomiting, new or worsening symptoms  Follow-up instructions discussed with patient: Follow-up with PCP in 5 days if not improving                                   Medical Decision Making Risk Prescription drug management.   In regards to the patient's sore throat today, the following dangerous and potentially life threatening etiologies were considered on the differential diagnosis: Lugwig's angina, uvulitis, epiglottis, peritonsillar abscess, retropharyngeal abscess, Lemierre's syndrome. Also considered were more common causes such as: streptococcal pharyngitis, gonococcal pharyngitis, non-bacterial pharyngitis (cold viruses, HSV/coxsackievirus, influenza, COVID-19, infectious mononucleosis, oropharyngeal candidiasis), and other non-infectious causes including seasonal allergies/post-nasal drip, GERD/esophagitis, trauma.   Patient is very uncomfortable due to pain in the throat.  She was treated with IM Decadron  and IM Toradol  which should help.  She will be given amoxicillin  to take daily.  Discussed needing to rest, maintain good hydration at home.  The patient's vital signs, pertinent lab work and imaging were reviewed and interpreted as discussed in the ED course. Hospitalization was considered for further testing, treatments, or serial exams/observation. However as patient is well-appearing, has a stable exam, and reassuring studies today, I do not feel that they warrant admission at this time. This plan was discussed with the patient who verbalizes agreement and comfort with this plan and seems reliable and able to return to the Emergency Department with worsening or changing symptoms.       Final diagnoses:  Strep throat    ED Discharge Orders          Ordered    amoxicillin  (AMOXIL ) 500 MG capsule  Daily        11/08/23 1007               Desiderio Chew,  PA-C 11/08/23 1011    Tegeler, Lonni PARAS, MD 11/08/23 1526

## 2023-11-08 NOTE — ED Triage Notes (Signed)
 C/o sore throat x 1 days. Reports generalized.

## 2024-02-25 ENCOUNTER — Other Ambulatory Visit: Payer: Self-pay

## 2024-02-25 ENCOUNTER — Emergency Department
Admission: EM | Admit: 2024-02-25 | Discharge: 2024-02-26 | Disposition: A | Discharge status: Home / Self Care | Attending: Emergency Medicine | Admitting: Emergency Medicine

## 2024-02-25 DIAGNOSIS — J45909 Unspecified asthma, uncomplicated: Secondary | ICD-10-CM | POA: Insufficient documentation

## 2024-02-25 DIAGNOSIS — Z8616 Personal history of COVID-19: Secondary | ICD-10-CM | POA: Insufficient documentation

## 2024-02-25 DIAGNOSIS — N766 Ulceration of vulva: Secondary | ICD-10-CM

## 2024-02-25 LAB — SYPHILIS: RPR W/REFLEX TO RPR TITER AND TREPONEMAL ANTIBODIES, TRADITIONAL SCREENING AND DIAGNOSIS ALGORITHM: RPR Ser Ql: NONREACTIVE

## 2024-02-25 LAB — CHLAMYDIA/NGC RT PCR (ARMC ONLY)
Chlamydia Tr: NOT DETECTED
N gonorrhoeae: NOT DETECTED

## 2024-02-25 LAB — WET PREP, GENITAL
Sperm: NONE SEEN
Trich, Wet Prep: NONE SEEN
WBC, Wet Prep HPF POC: >=10 — AB (ref <10)
Yeast Wet Prep HPF POC: NONE SEEN

## 2024-02-25 LAB — HIV ANTIBODY (ROUTINE TESTING W REFLEX): HIV Screen 4th Generation wRfx: NONREACTIVE

## 2024-02-25 MED ORDER — LIDOCAINE 4 % EX CREA
TOPICAL_CREAM | Freq: Once | CUTANEOUS | Status: AC
  Administered 2024-02-25: 1 via TOPICAL
  Filled 2024-02-25: qty 5

## 2024-02-25 MED ORDER — VALACYCLOVIR HCL 1 G PO TABS: 1000.0000 mg | ORAL_TABLET | Freq: Two times a day (BID) | ORAL | 0 refills | Status: AC

## 2024-02-25 MED ORDER — VALACYCLOVIR HCL 1 G PO TABS: 1000.0000 mg | ORAL_TABLET | Freq: Two times a day (BID) | ORAL | 0 refills | Status: DC

## 2024-02-25 MED ORDER — ONDANSETRON 4 MG PO TBDP: 4.0000 mg | ORAL_TABLET | Freq: Four times a day (QID) | ORAL | 0 refills | Status: AC | PRN

## 2024-02-25 MED ORDER — IBUPROFEN 800 MG PO TABS: 800.0000 mg | ORAL_TABLET | Freq: Three times a day (TID) | ORAL | 0 refills | Status: AC | PRN

## 2024-02-25 MED ORDER — IBUPROFEN 800 MG PO TABS
800.0000 mg | ORAL_TABLET | Freq: Once | ORAL | Status: AC
  Administered 2024-02-25: 800 mg via ORAL
  Filled 2024-02-25: qty 1

## 2024-02-25 MED ORDER — LIDOCAINE-PRILOCAINE 2.5-2.5 % EX CREA: 1.0000 | TOPICAL_CREAM | CUTANEOUS | 0 refills | Status: AC | PRN

## 2024-02-25 MED ORDER — VALACYCLOVIR HCL 500 MG PO TABS
1000.0000 mg | ORAL_TABLET | Freq: Once | ORAL | Status: AC
  Administered 2024-02-25: 1000 mg via ORAL
  Filled 2024-02-25: qty 2

## 2024-02-25 MED ORDER — OXYCODONE HCL 5 MG PO TABS: 5.0000 mg | ORAL_TABLET | Freq: Three times a day (TID) | ORAL | 0 refills | Status: AC | PRN

## 2024-02-25 NOTE — Discharge Instructions (Signed)
 You may alternate over the counter Tylenol  1000 mg every 6 hours as needed for pain, fever and Ibuprofen  800 mg every 6-8 hours as needed for pain, fever.  Please take Ibuprofen  with food.  Do not take more than 4000 mg of Tylenol  (acetaminophen ) in a 24 hour period.  You are being provided a prescription for opiates (also known as narcotics) for pain control.  Opiates can be addictive and should only be used when absolutely necessary for pain control when other alternatives do not work.  We recommend you only use them for the recommended amount of time and only as prescribed.  Please do not take with other sedative medications or alcohol.  Please do not drive, operate machinery, make important decisions while taking opiates.  Please note that these medications can be addictive and have high abuse potential.  Patients can become addicted to narcotics after only taking them for a few days.  Please keep these medications locked away from children, teenagers or any family members with history of substance abuse.  Narcotic pain medicine may also make you constipated.  You may use over-the-counter medications such as MiraLAX, Colace to prevent constipation.  If you become constipated, you may use over-the-counter enemas as needed.  Itching and nausea are also common side effects of narcotic pain medication.  If you develop uncontrolled vomiting or a rash, please stop these medications and seek medical care.  We have performed STD screening today.  You may follow-up on these results in MyChart.  If anything is abnormal you will be contacted.  Please continue your Valtrex  as prescribed.  Please avoid any sexual intercourse until all lesions have resolved and you have seen your test results.  If any of your test results are positive for an STI or any recent sexual partners will need to be tested and treated as well as appropriate.
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
## 2024-02-27 LAB — HSV CULTURE AND TYPING

## 2024-02-29 ENCOUNTER — Ambulatory Visit: Admitting: Obstetrics & Gynecology

## 2024-03-13 ENCOUNTER — Ambulatory Visit: Admitting: Obstetrics
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
